# Patient Record
Sex: Female | Born: 1940
Health system: Southern US, Community
[De-identification: ages and names within clinical notes are randomized; demographics above are authoritative.]

## PROBLEM LIST (undated history)

## (undated) DIAGNOSIS — F419 Anxiety disorder, unspecified: Secondary | ICD-10-CM

## (undated) DIAGNOSIS — N63 Unspecified lump in unspecified breast: Secondary | ICD-10-CM

## (undated) DIAGNOSIS — N189 Chronic kidney disease, unspecified: Secondary | ICD-10-CM

## (undated) DIAGNOSIS — F32A Depression, unspecified: Secondary | ICD-10-CM

## (undated) DIAGNOSIS — H269 Unspecified cataract: Secondary | ICD-10-CM

## (undated) DIAGNOSIS — I639 Cerebral infarction, unspecified: Secondary | ICD-10-CM

## (undated) DIAGNOSIS — F329 Major depressive disorder, single episode, unspecified: Secondary | ICD-10-CM

## (undated) DIAGNOSIS — U071 COVID-19: Secondary | ICD-10-CM

## (undated) DIAGNOSIS — K219 Gastro-esophageal reflux disease without esophagitis: Secondary | ICD-10-CM

## (undated) DIAGNOSIS — Z9181 History of falling: Secondary | ICD-10-CM

## (undated) DIAGNOSIS — E119 Type 2 diabetes mellitus without complications: Secondary | ICD-10-CM

## (undated) DIAGNOSIS — R197 Diarrhea, unspecified: Secondary | ICD-10-CM

## (undated) DIAGNOSIS — K52839 Microscopic colitis, unspecified: Secondary | ICD-10-CM

## (undated) DIAGNOSIS — E079 Disorder of thyroid, unspecified: Secondary | ICD-10-CM

## (undated) DIAGNOSIS — I10 Essential (primary) hypertension: Secondary | ICD-10-CM

## (undated) HISTORY — PX: APPENDECTOMY: SHX54

## (undated) HISTORY — DX: Cerebral infarction, unspecified: I63.9

## (undated) HISTORY — PX: CHOLECYSTECTOMY: SHX55

## (undated) HISTORY — DX: Unspecified lump in unspecified breast: N63.0

## (undated) HISTORY — DX: Depression, unspecified: F32.A

## (undated) HISTORY — PX: VAGINAL HYSTERECTOMY: SUR661

## (undated) HISTORY — PX: COLONOSCOPY: SHX174

## (undated) HISTORY — DX: Anxiety disorder, unspecified: F41.9

## (undated) HISTORY — DX: Essential (primary) hypertension: I10

## (undated) HISTORY — PX: TONSILLECTOMY: SUR1361

## (undated) HISTORY — PX: OTHER SURGICAL HISTORY: SHX169

## (undated) HISTORY — PX: HEMORRHOID SURGERY: SHX153

## (undated) HISTORY — PX: UPPER GASTROINTESTINAL ENDOSCOPY: SHX188

## (undated) HISTORY — PX: CATARACT EXTRACTION: SUR2

## (undated) HISTORY — DX: Microscopic colitis, unspecified: K52.839

## (undated) HISTORY — DX: Chronic kidney disease, unspecified: N18.9

## (undated) HISTORY — DX: Unspecified cataract: H26.9

## (undated) HISTORY — DX: Gastro-esophageal reflux disease without esophagitis: K21.9

## (undated) HISTORY — DX: COVID-19: U07.1

## (undated) HISTORY — DX: Diarrhea, unspecified: R19.7

## (undated) HISTORY — DX: History of falling: Z91.81

---
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
## 2005-06-10 ENCOUNTER — Ambulatory Visit (HOSPITAL_COMMUNITY): Admission: RE | Admit: 2005-06-10 | Discharge: 2005-06-10 | Payer: Self-pay | Admitting: General Surgery

## 2005-08-02 ENCOUNTER — Ambulatory Visit: Payer: Self-pay | Admitting: Internal Medicine

## 2007-10-03 ENCOUNTER — Ambulatory Visit: Payer: Self-pay | Admitting: Vascular Surgery

## 2007-12-05 ENCOUNTER — Ambulatory Visit: Payer: Self-pay | Admitting: Vascular Surgery

## 2008-01-16 ENCOUNTER — Ambulatory Visit: Payer: Self-pay | Admitting: Vascular Surgery

## 2009-06-12 ENCOUNTER — Ambulatory Visit (HOSPITAL_COMMUNITY): Admission: RE | Admit: 2009-06-12 | Discharge: 2009-06-12 | Payer: Self-pay | Admitting: General Surgery

## 2010-07-09 LAB — BASIC METABOLIC PANEL
Calcium: 10.1 mg/dL (ref 8.4–10.5)
GFR calc Af Amer: 55 mL/min — ABNORMAL LOW (ref >60)
GFR calc non Af Amer: 45 mL/min — ABNORMAL LOW (ref >60)
Glucose, Bld: 83 mg/dL (ref 70–99)
Sodium: 140 mEq/L (ref 135–145)

## 2010-07-09 LAB — CBC
Hemoglobin: 14.9 g/dL (ref 12.0–15.0)
RDW: 13.2 % (ref 11.5–15.5)
WBC: 7.3 10*3/uL (ref 4.0–10.5)

## 2010-07-09 LAB — CLOTEST (H. PYLORI), BIOPSY: Helicobacter screen: NEGATIVE

## 2010-07-09 LAB — HEPATIC FUNCTION PANEL
ALT: 21 U/L (ref 0–35)
AST: 23 U/L (ref 0–37)
Albumin: 4.3 g/dL (ref 3.5–5.2)
Total Bilirubin: 0.7 mg/dL (ref 0.3–1.2)

## 2010-08-31 NOTE — Assessment & Plan Note (Signed)
OFFICE VISIT   Ellen, Cruz  DOB:  1940/12/23                                       01/16/2008  ZOXWR#:[id number]   The patient presents today for followup of her sclerotherapy of painful  pretibial reticular varicosities on 12/05/2007.  She was unable to keep  her initial postop visit.  She does have good result so far.  She does  have closure of these reticular veins with some residual thrombus in  them.  She does have some tenderness over this and I explained that this  will continue to resolve as the thrombus resolves as well.  I am quite  pleased with her initial result as is the patient.  She will see [place]  again on an as-needed basis.   Larina Earthly, M.D.  Electronically Signed   TFE/MEDQ  D:  01/16/2008  T:  01/17/2008  Job:  [id number]

## 2010-08-31 NOTE — Letter (Signed)
October 03, 2007   Ellen Cruz  [address]  [place], [place] [address]   Re:  Ellen Cruz, ROADS                  DOB:  14-Mar-1941   Dear Aurther Loft:   Thank you for asking me to see this patient for evaluation of her venous  pathology.  As you know, she is a pleasant 70 year old white female with  concern regarding a pretibial varicosity on her left leg.  She does have  a history of very thin skin bilaterally and has had slow healing on the  right pretibial wound from striking this.  She does not have any history  of deep vein thrombosis and no history of significant swelling.  She  reports significant pain, specifically over this area of varicosity over  her left pretibial area.  She reports this is worse with prolonged  standing.   PAST MEDICAL HISTORY:  Significant for elevated cholesterol.  She does  have a history of allergic rhinitis and anxiety.   SOCIAL HISTORY:  She is married with 2 children.  She is retired.  She  does not smoke, or drink alcohol.   REVIEW OF SYSTEMS:  Her weight is reported at 130 pounds, height is 5  feet 6 inches tall.  She has no cardiac, pulmonary, or GI dysfunction.  She does have occasional diarrhea.  Does have pain in her legs.   MEDICATION ALLERGIES:  Codeine and Darvocet.   CURRENT MEDICATIONS:  Valium 10 mg as needed.  Meclizine.   PHYSICAL EXAM:  Well-developed, well-nourished white female appearing  stated age of 58.  Blood pressure is 157/98, pulse 89, respirations 18.  She does have 1 to 2+ posterior tibial pulses bilaterally.  She does  have diffuse tenderness over both lower extremities.  She does not have  any large venous varicosities.  She does have an area of pretibial wound  on the right and on examining this, it does have an eschar present that  appears to be healing.  On the left leg, the area of concern is noted  for a reticular varicosity from just above the ankle to just below the  knee in her pretibial area.  She  does have specific tenderness over  this.   She underwent venous duplex by me and this showed no evidence of reflux  or valvular incompetence of her saphenous vein.  I discussed the  significance of this with the patient.  I explained that it is somewhat  unusual to have such severe pain with a reticular varicosity, but none  the less, she does not appear to have any more serious venous pathology.  I did explain the option of sclerotherapy of this reticular vein to  hopefully achieve symptom relief.  She wishes to proceed with this.  We  will gain pre-approval from her insurance carrier for coverage of this.  She understands it is a brief outpatient procedure in our office.  We  will schedule her at her convenience.  Once again, thank you.   Larina Earthly, M.D.  Electronically Signed   TFE/MEDQ  D:  10/03/2007  T:  10/04/2007  Job:  [id number]

## 2010-08-31 NOTE — Assessment & Plan Note (Signed)
OFFICE VISIT   ORTENCIA, ASKARI  DOB:  10-31-1940                                       12/05/2007  ZOXWR#:[id number]   The patient presents today for sclerotherapy of a reticular varicosity  over her left pretibial area.  She has had persistent pain specifically  over the pretibial reticular vein and extending up onto the knee.  Under  sterile conditions she had a 0.3% sodium tetradecyl injection with a 30  gauge needle.  She had a total of  4 mL.  She had placement of a  compression garment and I plan to see her again in 1 month for continued  followup.   Larina Earthly, M.D.  Electronically Signed   TFE/MEDQ  D:  12/05/2007  T:  12/06/2007  Job:  [id number]

## 2010-09-03 NOTE — H&P (Signed)
NAMESHAMARA, SOZA            ACCOUNT NO.:  [id number]   MEDICAL RECORD NO.:  [id number]           PATIENT TYPE:  AMB   LOCATION:                                FACILITY:  [facility]   PHYSICIAN:  Dalia Heading, M.D.  DATE OF BIRTH:  May 28, 1940   DATE OF ADMISSION:  06/10/2005  DATE OF DISCHARGE:  LH                                HISTORY & PHYSICAL   CHIEF COMPLAINT:  Diarrhea.   HISTORY OF PRESENT ILLNESS:  The patient is a 70 year old, white female who  is referred for endoscopic evaluation.  She needs a colonoscopy for  diarrhea.  It has been present since October 2006.  She was started on  treatment for Staphylococcus infection at that time.  No abdominal pain,  weight loss, nausea, vomiting, constipation, melena or hematochezia have  been noted.  She has never had a recent colonoscopy.  Her father is still  alive with a history of colon carcinoma.   PAST MEDICAL HISTORY:  As noted above.   PAST SURGICAL HISTORY:  1.  Appendectomy.  2.  Hysterectomy.  3.  Tonsillectomy.  4.  Left elbow surgery.   CURRENT MEDICATIONS:  None.   ALLERGIES:  CODEINE and DARVOCET.   REVIEW OF SYSTEMS:  Noncontributory.   PHYSICAL EXAMINATION:  GENERAL:  The patient is a well-developed, well-  nourished, white female in no acute distress.  LUNGS:  Clear to auscultation with equal breath sounds bilaterally.  HEART:  Regular rate and rhythm without S3, S4 or murmurs.  ABDOMEN:  Soft, nontender, nondistended.  No hepatosplenomegaly or masses  are noted.  RECTAL:  Deferred to the procedure.   IMPRESSION:  Diarrhea.   PLAN:  The patient is scheduled for a colonoscopy on June 10, 2005.  The  risks and benefits of the procedure including bleeding and perforation were  fully explained to the patient gaining informed consent.      Dalia Heading, M.D.  Electronically Signed     MAJ/MEDQ  D:  06/02/2005  T:  06/02/2005  Job:  [id number]   cc:   Donzetta Sprung  Fax: [phone]

## 2011-04-29 DIAGNOSIS — E782 Mixed hyperlipidemia: Secondary | ICD-10-CM | POA: Diagnosis not present

## 2011-05-06 DIAGNOSIS — G4700 Insomnia, unspecified: Secondary | ICD-10-CM | POA: Diagnosis not present

## 2011-05-06 DIAGNOSIS — E782 Mixed hyperlipidemia: Secondary | ICD-10-CM | POA: Diagnosis not present

## 2011-05-06 DIAGNOSIS — E039 Hypothyroidism, unspecified: Secondary | ICD-10-CM | POA: Diagnosis not present

## 2011-05-06 DIAGNOSIS — J309 Allergic rhinitis, unspecified: Secondary | ICD-10-CM | POA: Diagnosis not present

## 2011-05-06 DIAGNOSIS — M1990 Unspecified osteoarthritis, unspecified site: Secondary | ICD-10-CM | POA: Diagnosis not present

## 2011-05-06 DIAGNOSIS — IMO0002 Reserved for concepts with insufficient information to code with codable children: Secondary | ICD-10-CM | POA: Diagnosis not present

## 2011-08-08 DIAGNOSIS — G4700 Insomnia, unspecified: Secondary | ICD-10-CM | POA: Diagnosis not present

## 2011-08-08 DIAGNOSIS — E039 Hypothyroidism, unspecified: Secondary | ICD-10-CM | POA: Diagnosis not present

## 2011-08-08 DIAGNOSIS — E7800 Pure hypercholesterolemia, unspecified: Secondary | ICD-10-CM | POA: Diagnosis not present

## 2011-08-08 DIAGNOSIS — E119 Type 2 diabetes mellitus without complications: Secondary | ICD-10-CM | POA: Diagnosis not present

## 2011-08-08 DIAGNOSIS — IMO0002 Reserved for concepts with insufficient information to code with codable children: Secondary | ICD-10-CM | POA: Diagnosis not present

## 2011-08-08 DIAGNOSIS — M1990 Unspecified osteoarthritis, unspecified site: Secondary | ICD-10-CM | POA: Diagnosis not present

## 2011-08-08 DIAGNOSIS — E782 Mixed hyperlipidemia: Secondary | ICD-10-CM | POA: Diagnosis not present

## 2011-08-08 DIAGNOSIS — H6120 Impacted cerumen, unspecified ear: Secondary | ICD-10-CM | POA: Diagnosis not present

## 2011-08-08 DIAGNOSIS — Z79899 Other long term (current) drug therapy: Secondary | ICD-10-CM | POA: Diagnosis not present

## 2011-08-08 DIAGNOSIS — J309 Allergic rhinitis, unspecified: Secondary | ICD-10-CM | POA: Diagnosis not present

## 2011-08-15 DIAGNOSIS — H9209 Otalgia, unspecified ear: Secondary | ICD-10-CM | POA: Diagnosis not present

## 2011-08-15 DIAGNOSIS — H6120 Impacted cerumen, unspecified ear: Secondary | ICD-10-CM | POA: Diagnosis not present

## 2011-08-15 DIAGNOSIS — J029 Acute pharyngitis, unspecified: Secondary | ICD-10-CM | POA: Diagnosis not present

## 2011-10-28 DIAGNOSIS — I10 Essential (primary) hypertension: Secondary | ICD-10-CM | POA: Diagnosis not present

## 2011-10-28 DIAGNOSIS — E039 Hypothyroidism, unspecified: Secondary | ICD-10-CM | POA: Diagnosis not present

## 2011-10-28 DIAGNOSIS — E782 Mixed hyperlipidemia: Secondary | ICD-10-CM | POA: Diagnosis not present

## 2011-11-04 DIAGNOSIS — M719 Bursopathy, unspecified: Secondary | ICD-10-CM | POA: Diagnosis not present

## 2011-11-04 DIAGNOSIS — M1990 Unspecified osteoarthritis, unspecified site: Secondary | ICD-10-CM | POA: Diagnosis not present

## 2011-11-04 DIAGNOSIS — J309 Allergic rhinitis, unspecified: Secondary | ICD-10-CM | POA: Diagnosis not present

## 2011-11-04 DIAGNOSIS — E782 Mixed hyperlipidemia: Secondary | ICD-10-CM | POA: Diagnosis not present

## 2011-11-04 DIAGNOSIS — IMO0002 Reserved for concepts with insufficient information to code with codable children: Secondary | ICD-10-CM | POA: Diagnosis not present

## 2011-11-04 DIAGNOSIS — G4700 Insomnia, unspecified: Secondary | ICD-10-CM | POA: Diagnosis not present

## 2011-11-04 DIAGNOSIS — E039 Hypothyroidism, unspecified: Secondary | ICD-10-CM | POA: Diagnosis not present

## 2011-11-04 DIAGNOSIS — M67919 Unspecified disorder of synovium and tendon, unspecified shoulder: Secondary | ICD-10-CM | POA: Diagnosis not present

## 2011-11-04 DIAGNOSIS — J029 Acute pharyngitis, unspecified: Secondary | ICD-10-CM | POA: Diagnosis not present

## 2011-11-18 DIAGNOSIS — H43399 Other vitreous opacities, unspecified eye: Secondary | ICD-10-CM | POA: Diagnosis not present

## 2011-11-18 DIAGNOSIS — E119 Type 2 diabetes mellitus without complications: Secondary | ICD-10-CM | POA: Diagnosis not present

## 2011-12-05 DIAGNOSIS — H8110 Benign paroxysmal vertigo, unspecified ear: Secondary | ICD-10-CM | POA: Diagnosis not present

## 2011-12-05 DIAGNOSIS — R197 Diarrhea, unspecified: Secondary | ICD-10-CM | POA: Diagnosis not present

## 2011-12-05 DIAGNOSIS — H6120 Impacted cerumen, unspecified ear: Secondary | ICD-10-CM | POA: Diagnosis not present

## 2011-12-05 DIAGNOSIS — R110 Nausea: Secondary | ICD-10-CM | POA: Diagnosis not present

## 2011-12-24 DIAGNOSIS — S91009A Unspecified open wound, unspecified ankle, initial encounter: Secondary | ICD-10-CM | POA: Diagnosis not present

## 2011-12-24 DIAGNOSIS — Z23 Encounter for immunization: Secondary | ICD-10-CM | POA: Diagnosis not present

## 2011-12-24 DIAGNOSIS — Z79899 Other long term (current) drug therapy: Secondary | ICD-10-CM | POA: Diagnosis not present

## 2011-12-26 DIAGNOSIS — S81009A Unspecified open wound, unspecified knee, initial encounter: Secondary | ICD-10-CM | POA: Diagnosis not present

## 2012-01-03 DIAGNOSIS — S81009A Unspecified open wound, unspecified knee, initial encounter: Secondary | ICD-10-CM | POA: Diagnosis not present

## 2012-01-03 DIAGNOSIS — S81809A Unspecified open wound, unspecified lower leg, initial encounter: Secondary | ICD-10-CM | POA: Diagnosis not present

## 2012-01-09 DIAGNOSIS — S91009A Unspecified open wound, unspecified ankle, initial encounter: Secondary | ICD-10-CM | POA: Diagnosis not present

## 2012-01-09 DIAGNOSIS — S81809A Unspecified open wound, unspecified lower leg, initial encounter: Secondary | ICD-10-CM | POA: Diagnosis not present

## 2012-01-11 DIAGNOSIS — E119 Type 2 diabetes mellitus without complications: Secondary | ICD-10-CM | POA: Diagnosis not present

## 2012-01-11 DIAGNOSIS — Z833 Family history of diabetes mellitus: Secondary | ICD-10-CM | POA: Diagnosis not present

## 2012-01-11 DIAGNOSIS — S81009A Unspecified open wound, unspecified knee, initial encounter: Secondary | ICD-10-CM | POA: Diagnosis not present

## 2012-01-11 DIAGNOSIS — Z8249 Family history of ischemic heart disease and other diseases of the circulatory system: Secondary | ICD-10-CM | POA: Diagnosis not present

## 2012-01-11 DIAGNOSIS — E039 Hypothyroidism, unspecified: Secondary | ICD-10-CM | POA: Diagnosis not present

## 2012-01-11 DIAGNOSIS — IMO0002 Reserved for concepts with insufficient information to code with codable children: Secondary | ICD-10-CM | POA: Diagnosis not present

## 2012-01-11 DIAGNOSIS — Z885 Allergy status to narcotic agent status: Secondary | ICD-10-CM | POA: Diagnosis not present

## 2012-01-11 DIAGNOSIS — I83009 Varicose veins of unspecified lower extremity with ulcer of unspecified site: Secondary | ICD-10-CM | POA: Diagnosis not present

## 2012-01-11 DIAGNOSIS — E8779 Other fluid overload: Secondary | ICD-10-CM | POA: Diagnosis not present

## 2012-01-11 DIAGNOSIS — I8390 Asymptomatic varicose veins of unspecified lower extremity: Secondary | ICD-10-CM | POA: Diagnosis not present

## 2012-01-11 DIAGNOSIS — L97909 Non-pressure chronic ulcer of unspecified part of unspecified lower leg with unspecified severity: Secondary | ICD-10-CM | POA: Diagnosis not present

## 2012-01-11 DIAGNOSIS — L97809 Non-pressure chronic ulcer of other part of unspecified lower leg with unspecified severity: Secondary | ICD-10-CM | POA: Diagnosis not present

## 2012-01-11 DIAGNOSIS — Z809 Family history of malignant neoplasm, unspecified: Secondary | ICD-10-CM | POA: Diagnosis not present

## 2012-01-11 DIAGNOSIS — M79609 Pain in unspecified limb: Secondary | ICD-10-CM | POA: Diagnosis not present

## 2012-01-11 DIAGNOSIS — Z79899 Other long term (current) drug therapy: Secondary | ICD-10-CM | POA: Diagnosis not present

## 2012-01-11 DIAGNOSIS — S91009A Unspecified open wound, unspecified ankle, initial encounter: Secondary | ICD-10-CM | POA: Diagnosis not present

## 2012-01-18 DIAGNOSIS — L97909 Non-pressure chronic ulcer of unspecified part of unspecified lower leg with unspecified severity: Secondary | ICD-10-CM | POA: Diagnosis not present

## 2012-01-18 DIAGNOSIS — IMO0001 Reserved for inherently not codable concepts without codable children: Secondary | ICD-10-CM | POA: Diagnosis not present

## 2012-01-18 DIAGNOSIS — L97809 Non-pressure chronic ulcer of other part of unspecified lower leg with unspecified severity: Secondary | ICD-10-CM | POA: Diagnosis not present

## 2012-01-18 DIAGNOSIS — I872 Venous insufficiency (chronic) (peripheral): Secondary | ICD-10-CM | POA: Diagnosis not present

## 2012-01-18 DIAGNOSIS — I83009 Varicose veins of unspecified lower extremity with ulcer of unspecified site: Secondary | ICD-10-CM | POA: Diagnosis not present

## 2012-01-25 DIAGNOSIS — L97809 Non-pressure chronic ulcer of other part of unspecified lower leg with unspecified severity: Secondary | ICD-10-CM | POA: Diagnosis not present

## 2012-01-25 DIAGNOSIS — L97909 Non-pressure chronic ulcer of unspecified part of unspecified lower leg with unspecified severity: Secondary | ICD-10-CM | POA: Diagnosis not present

## 2012-01-25 DIAGNOSIS — I872 Venous insufficiency (chronic) (peripheral): Secondary | ICD-10-CM | POA: Diagnosis not present

## 2012-01-25 DIAGNOSIS — IMO0001 Reserved for inherently not codable concepts without codable children: Secondary | ICD-10-CM | POA: Diagnosis not present

## 2012-02-01 DIAGNOSIS — L97809 Non-pressure chronic ulcer of other part of unspecified lower leg with unspecified severity: Secondary | ICD-10-CM | POA: Diagnosis not present

## 2012-02-01 DIAGNOSIS — I83009 Varicose veins of unspecified lower extremity with ulcer of unspecified site: Secondary | ICD-10-CM | POA: Diagnosis not present

## 2012-02-01 DIAGNOSIS — L97909 Non-pressure chronic ulcer of unspecified part of unspecified lower leg with unspecified severity: Secondary | ICD-10-CM | POA: Diagnosis not present

## 2012-02-01 DIAGNOSIS — IMO0001 Reserved for inherently not codable concepts without codable children: Secondary | ICD-10-CM | POA: Diagnosis not present

## 2012-02-01 DIAGNOSIS — I872 Venous insufficiency (chronic) (peripheral): Secondary | ICD-10-CM | POA: Diagnosis not present

## 2012-02-01 DIAGNOSIS — S81809A Unspecified open wound, unspecified lower leg, initial encounter: Secondary | ICD-10-CM | POA: Diagnosis not present

## 2012-02-16 DIAGNOSIS — E039 Hypothyroidism, unspecified: Secondary | ICD-10-CM | POA: Diagnosis not present

## 2012-02-16 DIAGNOSIS — E782 Mixed hyperlipidemia: Secondary | ICD-10-CM | POA: Diagnosis not present

## 2012-02-16 DIAGNOSIS — G4700 Insomnia, unspecified: Secondary | ICD-10-CM | POA: Diagnosis not present

## 2012-02-16 DIAGNOSIS — E119 Type 2 diabetes mellitus without complications: Secondary | ICD-10-CM | POA: Diagnosis not present

## 2012-02-16 DIAGNOSIS — IMO0002 Reserved for concepts with insufficient information to code with codable children: Secondary | ICD-10-CM | POA: Diagnosis not present

## 2012-02-16 DIAGNOSIS — I10 Essential (primary) hypertension: Secondary | ICD-10-CM | POA: Diagnosis not present

## 2012-02-27 DIAGNOSIS — J029 Acute pharyngitis, unspecified: Secondary | ICD-10-CM | POA: Diagnosis not present

## 2012-02-27 DIAGNOSIS — L600 Ingrowing nail: Secondary | ICD-10-CM | POA: Diagnosis not present

## 2012-04-06 DIAGNOSIS — J209 Acute bronchitis, unspecified: Secondary | ICD-10-CM | POA: Diagnosis not present

## 2012-04-06 DIAGNOSIS — J019 Acute sinusitis, unspecified: Secondary | ICD-10-CM | POA: Diagnosis not present

## 2012-05-10 DIAGNOSIS — H6120 Impacted cerumen, unspecified ear: Secondary | ICD-10-CM | POA: Diagnosis not present

## 2012-05-25 DIAGNOSIS — E782 Mixed hyperlipidemia: Secondary | ICD-10-CM | POA: Diagnosis not present

## 2012-05-25 DIAGNOSIS — E039 Hypothyroidism, unspecified: Secondary | ICD-10-CM | POA: Diagnosis not present

## 2012-05-25 DIAGNOSIS — IMO0001 Reserved for inherently not codable concepts without codable children: Secondary | ICD-10-CM | POA: Diagnosis not present

## 2012-06-25 DIAGNOSIS — R5381 Other malaise: Secondary | ICD-10-CM | POA: Diagnosis not present

## 2012-06-25 DIAGNOSIS — H8110 Benign paroxysmal vertigo, unspecified ear: Secondary | ICD-10-CM | POA: Diagnosis not present

## 2012-06-25 DIAGNOSIS — E119 Type 2 diabetes mellitus without complications: Secondary | ICD-10-CM | POA: Diagnosis not present

## 2012-06-25 DIAGNOSIS — E039 Hypothyroidism, unspecified: Secondary | ICD-10-CM | POA: Diagnosis not present

## 2012-08-07 DIAGNOSIS — E039 Hypothyroidism, unspecified: Secondary | ICD-10-CM | POA: Diagnosis not present

## 2012-08-09 DIAGNOSIS — I10 Essential (primary) hypertension: Secondary | ICD-10-CM | POA: Diagnosis not present

## 2012-08-09 DIAGNOSIS — H6120 Impacted cerumen, unspecified ear: Secondary | ICD-10-CM | POA: Diagnosis not present

## 2012-08-09 DIAGNOSIS — H8110 Benign paroxysmal vertigo, unspecified ear: Secondary | ICD-10-CM | POA: Diagnosis not present

## 2012-08-09 DIAGNOSIS — R5383 Other fatigue: Secondary | ICD-10-CM | POA: Diagnosis not present

## 2012-08-09 DIAGNOSIS — E782 Mixed hyperlipidemia: Secondary | ICD-10-CM | POA: Diagnosis not present

## 2012-08-09 DIAGNOSIS — E119 Type 2 diabetes mellitus without complications: Secondary | ICD-10-CM | POA: Diagnosis not present

## 2012-08-09 DIAGNOSIS — E039 Hypothyroidism, unspecified: Secondary | ICD-10-CM | POA: Diagnosis not present

## 2012-09-17 DIAGNOSIS — T148XXA Other injury of unspecified body region, initial encounter: Secondary | ICD-10-CM | POA: Diagnosis not present

## 2012-09-17 DIAGNOSIS — S8010XA Contusion of unspecified lower leg, initial encounter: Secondary | ICD-10-CM | POA: Diagnosis not present

## 2012-09-20 DIAGNOSIS — T148XXA Other injury of unspecified body region, initial encounter: Secondary | ICD-10-CM | POA: Diagnosis not present

## 2012-09-20 DIAGNOSIS — M7989 Other specified soft tissue disorders: Secondary | ICD-10-CM | POA: Diagnosis not present

## 2012-09-20 DIAGNOSIS — R609 Edema, unspecified: Secondary | ICD-10-CM | POA: Diagnosis not present

## 2012-09-20 DIAGNOSIS — S8010XA Contusion of unspecified lower leg, initial encounter: Secondary | ICD-10-CM | POA: Diagnosis not present

## 2012-09-20 DIAGNOSIS — M79609 Pain in unspecified limb: Secondary | ICD-10-CM | POA: Diagnosis not present

## 2012-09-24 DIAGNOSIS — E039 Hypothyroidism, unspecified: Secondary | ICD-10-CM | POA: Diagnosis not present

## 2012-09-24 DIAGNOSIS — E119 Type 2 diabetes mellitus without complications: Secondary | ICD-10-CM | POA: Diagnosis not present

## 2012-09-24 DIAGNOSIS — H8110 Benign paroxysmal vertigo, unspecified ear: Secondary | ICD-10-CM | POA: Diagnosis not present

## 2012-09-24 DIAGNOSIS — I10 Essential (primary) hypertension: Secondary | ICD-10-CM | POA: Diagnosis not present

## 2012-09-24 DIAGNOSIS — R5383 Other fatigue: Secondary | ICD-10-CM | POA: Diagnosis not present

## 2012-09-24 DIAGNOSIS — E782 Mixed hyperlipidemia: Secondary | ICD-10-CM | POA: Diagnosis not present

## 2012-10-01 DIAGNOSIS — E782 Mixed hyperlipidemia: Secondary | ICD-10-CM | POA: Diagnosis not present

## 2012-10-01 DIAGNOSIS — I10 Essential (primary) hypertension: Secondary | ICD-10-CM | POA: Diagnosis not present

## 2012-10-01 DIAGNOSIS — N189 Chronic kidney disease, unspecified: Secondary | ICD-10-CM | POA: Diagnosis not present

## 2012-10-01 DIAGNOSIS — R5381 Other malaise: Secondary | ICD-10-CM | POA: Diagnosis not present

## 2012-10-01 DIAGNOSIS — H8110 Benign paroxysmal vertigo, unspecified ear: Secondary | ICD-10-CM | POA: Diagnosis not present

## 2012-10-01 DIAGNOSIS — E039 Hypothyroidism, unspecified: Secondary | ICD-10-CM | POA: Diagnosis not present

## 2012-10-01 DIAGNOSIS — E119 Type 2 diabetes mellitus without complications: Secondary | ICD-10-CM | POA: Diagnosis not present

## 2012-10-01 DIAGNOSIS — R5383 Other fatigue: Secondary | ICD-10-CM | POA: Diagnosis not present

## 2012-12-28 DIAGNOSIS — M76899 Other specified enthesopathies of unspecified lower limb, excluding foot: Secondary | ICD-10-CM | POA: Diagnosis not present

## 2012-12-28 DIAGNOSIS — J019 Acute sinusitis, unspecified: Secondary | ICD-10-CM | POA: Diagnosis not present

## 2012-12-28 DIAGNOSIS — J029 Acute pharyngitis, unspecified: Secondary | ICD-10-CM | POA: Diagnosis not present

## 2013-01-23 DIAGNOSIS — M79609 Pain in unspecified limb: Secondary | ICD-10-CM | POA: Diagnosis not present

## 2013-01-23 DIAGNOSIS — L600 Ingrowing nail: Secondary | ICD-10-CM | POA: Diagnosis not present

## 2013-02-06 DIAGNOSIS — M79609 Pain in unspecified limb: Secondary | ICD-10-CM | POA: Diagnosis not present

## 2013-02-06 DIAGNOSIS — L600 Ingrowing nail: Secondary | ICD-10-CM | POA: Diagnosis not present

## 2013-02-11 DIAGNOSIS — M79609 Pain in unspecified limb: Secondary | ICD-10-CM | POA: Diagnosis not present

## 2013-02-11 DIAGNOSIS — H6120 Impacted cerumen, unspecified ear: Secondary | ICD-10-CM | POA: Diagnosis not present

## 2013-02-11 DIAGNOSIS — L600 Ingrowing nail: Secondary | ICD-10-CM | POA: Diagnosis not present

## 2013-02-12 DIAGNOSIS — R110 Nausea: Secondary | ICD-10-CM | POA: Diagnosis not present

## 2013-02-12 DIAGNOSIS — IMO0002 Reserved for concepts with insufficient information to code with codable children: Secondary | ICD-10-CM | POA: Diagnosis not present

## 2013-02-13 DIAGNOSIS — I10 Essential (primary) hypertension: Secondary | ICD-10-CM | POA: Diagnosis not present

## 2013-02-13 DIAGNOSIS — R5381 Other malaise: Secondary | ICD-10-CM | POA: Diagnosis not present

## 2013-02-13 DIAGNOSIS — E782 Mixed hyperlipidemia: Secondary | ICD-10-CM | POA: Diagnosis not present

## 2013-02-13 DIAGNOSIS — IMO0001 Reserved for inherently not codable concepts without codable children: Secondary | ICD-10-CM | POA: Diagnosis not present

## 2013-02-13 DIAGNOSIS — L509 Urticaria, unspecified: Secondary | ICD-10-CM | POA: Diagnosis not present

## 2013-02-13 DIAGNOSIS — E039 Hypothyroidism, unspecified: Secondary | ICD-10-CM | POA: Diagnosis not present

## 2013-02-18 DIAGNOSIS — H8110 Benign paroxysmal vertigo, unspecified ear: Secondary | ICD-10-CM | POA: Diagnosis not present

## 2013-02-18 DIAGNOSIS — I10 Essential (primary) hypertension: Secondary | ICD-10-CM | POA: Diagnosis not present

## 2013-02-18 DIAGNOSIS — E039 Hypothyroidism, unspecified: Secondary | ICD-10-CM | POA: Diagnosis not present

## 2013-02-18 DIAGNOSIS — R5381 Other malaise: Secondary | ICD-10-CM | POA: Diagnosis not present

## 2013-02-18 DIAGNOSIS — Z1331 Encounter for screening for depression: Secondary | ICD-10-CM | POA: Diagnosis not present

## 2013-02-18 DIAGNOSIS — E1149 Type 2 diabetes mellitus with other diabetic neurological complication: Secondary | ICD-10-CM | POA: Diagnosis not present

## 2013-02-18 DIAGNOSIS — E782 Mixed hyperlipidemia: Secondary | ICD-10-CM | POA: Diagnosis not present

## 2013-02-25 DIAGNOSIS — S81009A Unspecified open wound, unspecified knee, initial encounter: Secondary | ICD-10-CM | POA: Diagnosis not present

## 2013-02-27 DIAGNOSIS — M79609 Pain in unspecified limb: Secondary | ICD-10-CM | POA: Diagnosis not present

## 2013-02-27 DIAGNOSIS — L600 Ingrowing nail: Secondary | ICD-10-CM | POA: Diagnosis not present

## 2013-02-28 DIAGNOSIS — N189 Chronic kidney disease, unspecified: Secondary | ICD-10-CM | POA: Diagnosis not present

## 2013-03-11 DIAGNOSIS — L600 Ingrowing nail: Secondary | ICD-10-CM | POA: Diagnosis not present

## 2013-03-11 DIAGNOSIS — M79609 Pain in unspecified limb: Secondary | ICD-10-CM | POA: Diagnosis not present

## 2013-03-15 DIAGNOSIS — E1149 Type 2 diabetes mellitus with other diabetic neurological complication: Secondary | ICD-10-CM | POA: Diagnosis not present

## 2013-03-15 DIAGNOSIS — H8110 Benign paroxysmal vertigo, unspecified ear: Secondary | ICD-10-CM | POA: Diagnosis not present

## 2013-03-15 DIAGNOSIS — I10 Essential (primary) hypertension: Secondary | ICD-10-CM | POA: Diagnosis not present

## 2013-03-15 DIAGNOSIS — R5381 Other malaise: Secondary | ICD-10-CM | POA: Diagnosis not present

## 2013-03-15 DIAGNOSIS — E782 Mixed hyperlipidemia: Secondary | ICD-10-CM | POA: Diagnosis not present

## 2013-03-15 DIAGNOSIS — E039 Hypothyroidism, unspecified: Secondary | ICD-10-CM | POA: Diagnosis not present

## 2013-04-08 DIAGNOSIS — B9789 Other viral agents as the cause of diseases classified elsewhere: Secondary | ICD-10-CM | POA: Diagnosis not present

## 2013-04-08 DIAGNOSIS — J029 Acute pharyngitis, unspecified: Secondary | ICD-10-CM | POA: Diagnosis not present

## 2013-04-19 DIAGNOSIS — H6120 Impacted cerumen, unspecified ear: Secondary | ICD-10-CM | POA: Diagnosis not present

## 2013-05-06 DIAGNOSIS — Z1231 Encounter for screening mammogram for malignant neoplasm of breast: Secondary | ICD-10-CM | POA: Diagnosis not present

## 2013-06-11 DIAGNOSIS — E782 Mixed hyperlipidemia: Secondary | ICD-10-CM | POA: Diagnosis not present

## 2013-06-11 DIAGNOSIS — M1990 Unspecified osteoarthritis, unspecified site: Secondary | ICD-10-CM | POA: Diagnosis not present

## 2013-06-11 DIAGNOSIS — N183 Chronic kidney disease, stage 3 unspecified: Secondary | ICD-10-CM | POA: Diagnosis not present

## 2013-06-11 DIAGNOSIS — I10 Essential (primary) hypertension: Secondary | ICD-10-CM | POA: Diagnosis not present

## 2013-06-11 DIAGNOSIS — E119 Type 2 diabetes mellitus without complications: Secondary | ICD-10-CM | POA: Diagnosis not present

## 2013-06-11 DIAGNOSIS — E039 Hypothyroidism, unspecified: Secondary | ICD-10-CM | POA: Diagnosis not present

## 2013-06-18 DIAGNOSIS — E1129 Type 2 diabetes mellitus with other diabetic kidney complication: Secondary | ICD-10-CM | POA: Diagnosis not present

## 2013-06-18 DIAGNOSIS — I10 Essential (primary) hypertension: Secondary | ICD-10-CM | POA: Diagnosis not present

## 2013-06-18 DIAGNOSIS — R5381 Other malaise: Secondary | ICD-10-CM | POA: Diagnosis not present

## 2013-06-18 DIAGNOSIS — R5383 Other fatigue: Secondary | ICD-10-CM | POA: Diagnosis not present

## 2013-06-18 DIAGNOSIS — K2100 Gastro-esophageal reflux disease with esophagitis, without bleeding: Secondary | ICD-10-CM | POA: Diagnosis not present

## 2013-06-18 DIAGNOSIS — E782 Mixed hyperlipidemia: Secondary | ICD-10-CM | POA: Diagnosis not present

## 2013-06-18 DIAGNOSIS — H8110 Benign paroxysmal vertigo, unspecified ear: Secondary | ICD-10-CM | POA: Diagnosis not present

## 2013-06-18 DIAGNOSIS — N183 Chronic kidney disease, stage 3 unspecified: Secondary | ICD-10-CM | POA: Diagnosis not present

## 2013-06-18 DIAGNOSIS — E039 Hypothyroidism, unspecified: Secondary | ICD-10-CM | POA: Diagnosis not present

## 2013-07-17 DIAGNOSIS — I10 Essential (primary) hypertension: Secondary | ICD-10-CM | POA: Diagnosis not present

## 2013-07-17 DIAGNOSIS — R5381 Other malaise: Secondary | ICD-10-CM | POA: Diagnosis not present

## 2013-07-17 DIAGNOSIS — R059 Cough, unspecified: Secondary | ICD-10-CM | POA: Diagnosis not present

## 2013-07-17 DIAGNOSIS — R05 Cough: Secondary | ICD-10-CM | POA: Diagnosis not present

## 2013-07-17 DIAGNOSIS — R300 Dysuria: Secondary | ICD-10-CM | POA: Diagnosis not present

## 2013-08-12 DIAGNOSIS — H6120 Impacted cerumen, unspecified ear: Secondary | ICD-10-CM | POA: Diagnosis not present

## 2013-09-02 DIAGNOSIS — R5381 Other malaise: Secondary | ICD-10-CM | POA: Diagnosis not present

## 2013-09-02 DIAGNOSIS — R5383 Other fatigue: Secondary | ICD-10-CM | POA: Diagnosis not present

## 2013-10-09 DIAGNOSIS — R5381 Other malaise: Secondary | ICD-10-CM | POA: Diagnosis not present

## 2013-10-09 DIAGNOSIS — R5383 Other fatigue: Secondary | ICD-10-CM | POA: Diagnosis not present

## 2013-10-28 DIAGNOSIS — G459 Transient cerebral ischemic attack, unspecified: Secondary | ICD-10-CM | POA: Diagnosis not present

## 2013-10-31 DIAGNOSIS — G459 Transient cerebral ischemic attack, unspecified: Secondary | ICD-10-CM | POA: Diagnosis not present

## 2013-11-13 DIAGNOSIS — N183 Chronic kidney disease, stage 3 unspecified: Secondary | ICD-10-CM | POA: Diagnosis not present

## 2013-11-13 DIAGNOSIS — I10 Essential (primary) hypertension: Secondary | ICD-10-CM | POA: Diagnosis not present

## 2013-11-13 DIAGNOSIS — IMO0001 Reserved for inherently not codable concepts without codable children: Secondary | ICD-10-CM | POA: Diagnosis not present

## 2013-11-13 DIAGNOSIS — R5381 Other malaise: Secondary | ICD-10-CM | POA: Diagnosis not present

## 2013-11-13 DIAGNOSIS — E039 Hypothyroidism, unspecified: Secondary | ICD-10-CM | POA: Diagnosis not present

## 2013-11-13 DIAGNOSIS — R5383 Other fatigue: Secondary | ICD-10-CM | POA: Diagnosis not present

## 2013-11-19 DIAGNOSIS — I10 Essential (primary) hypertension: Secondary | ICD-10-CM | POA: Diagnosis not present

## 2013-11-19 DIAGNOSIS — Z1331 Encounter for screening for depression: Secondary | ICD-10-CM | POA: Diagnosis not present

## 2013-11-19 DIAGNOSIS — R5383 Other fatigue: Secondary | ICD-10-CM | POA: Diagnosis not present

## 2013-11-19 DIAGNOSIS — E1129 Type 2 diabetes mellitus with other diabetic kidney complication: Secondary | ICD-10-CM | POA: Diagnosis not present

## 2013-11-19 DIAGNOSIS — E039 Hypothyroidism, unspecified: Secondary | ICD-10-CM | POA: Diagnosis not present

## 2013-11-19 DIAGNOSIS — N183 Chronic kidney disease, stage 3 unspecified: Secondary | ICD-10-CM | POA: Diagnosis not present

## 2013-11-19 DIAGNOSIS — K2100 Gastro-esophageal reflux disease with esophagitis, without bleeding: Secondary | ICD-10-CM | POA: Diagnosis not present

## 2013-11-19 DIAGNOSIS — R5381 Other malaise: Secondary | ICD-10-CM | POA: Diagnosis not present

## 2013-11-19 DIAGNOSIS — E782 Mixed hyperlipidemia: Secondary | ICD-10-CM | POA: Diagnosis not present

## 2013-12-17 DIAGNOSIS — H6120 Impacted cerumen, unspecified ear: Secondary | ICD-10-CM | POA: Diagnosis not present

## 2014-03-14 DIAGNOSIS — E039 Hypothyroidism, unspecified: Secondary | ICD-10-CM | POA: Diagnosis not present

## 2014-03-14 DIAGNOSIS — E1165 Type 2 diabetes mellitus with hyperglycemia: Secondary | ICD-10-CM | POA: Diagnosis not present

## 2014-03-14 DIAGNOSIS — I10 Essential (primary) hypertension: Secondary | ICD-10-CM | POA: Diagnosis not present

## 2014-03-18 DIAGNOSIS — Z8669 Personal history of other diseases of the nervous system and sense organs: Secondary | ICD-10-CM | POA: Diagnosis not present

## 2014-03-21 DIAGNOSIS — E039 Hypothyroidism, unspecified: Secondary | ICD-10-CM | POA: Diagnosis not present

## 2014-03-21 DIAGNOSIS — E1122 Type 2 diabetes mellitus with diabetic chronic kidney disease: Secondary | ICD-10-CM | POA: Diagnosis not present

## 2014-03-21 DIAGNOSIS — E782 Mixed hyperlipidemia: Secondary | ICD-10-CM | POA: Diagnosis not present

## 2014-03-21 DIAGNOSIS — F411 Generalized anxiety disorder: Secondary | ICD-10-CM | POA: Diagnosis not present

## 2014-03-21 DIAGNOSIS — Z1389 Encounter for screening for other disorder: Secondary | ICD-10-CM | POA: Diagnosis not present

## 2014-03-21 DIAGNOSIS — E1142 Type 2 diabetes mellitus with diabetic polyneuropathy: Secondary | ICD-10-CM | POA: Diagnosis not present

## 2014-03-21 DIAGNOSIS — Z9189 Other specified personal risk factors, not elsewhere classified: Secondary | ICD-10-CM | POA: Diagnosis not present

## 2014-03-21 DIAGNOSIS — F324 Major depressive disorder, single episode, in partial remission: Secondary | ICD-10-CM | POA: Diagnosis not present

## 2014-06-17 DIAGNOSIS — Z8669 Personal history of other diseases of the nervous system and sense organs: Secondary | ICD-10-CM | POA: Diagnosis not present

## 2014-07-16 DIAGNOSIS — K210 Gastro-esophageal reflux disease with esophagitis: Secondary | ICD-10-CM | POA: Diagnosis not present

## 2014-07-16 DIAGNOSIS — E039 Hypothyroidism, unspecified: Secondary | ICD-10-CM | POA: Diagnosis not present

## 2014-07-16 DIAGNOSIS — N189 Chronic kidney disease, unspecified: Secondary | ICD-10-CM | POA: Diagnosis not present

## 2014-07-16 DIAGNOSIS — F324 Major depressive disorder, single episode, in partial remission: Secondary | ICD-10-CM | POA: Diagnosis not present

## 2014-07-16 DIAGNOSIS — E1122 Type 2 diabetes mellitus with diabetic chronic kidney disease: Secondary | ICD-10-CM | POA: Diagnosis not present

## 2014-07-16 DIAGNOSIS — I10 Essential (primary) hypertension: Secondary | ICD-10-CM | POA: Diagnosis not present

## 2014-07-24 DIAGNOSIS — F411 Generalized anxiety disorder: Secondary | ICD-10-CM | POA: Diagnosis not present

## 2014-07-24 DIAGNOSIS — J301 Allergic rhinitis due to pollen: Secondary | ICD-10-CM | POA: Diagnosis not present

## 2014-07-24 DIAGNOSIS — E782 Mixed hyperlipidemia: Secondary | ICD-10-CM | POA: Diagnosis not present

## 2014-07-24 DIAGNOSIS — N183 Chronic kidney disease, stage 3 (moderate): Secondary | ICD-10-CM | POA: Diagnosis not present

## 2014-07-24 DIAGNOSIS — K219 Gastro-esophageal reflux disease without esophagitis: Secondary | ICD-10-CM | POA: Diagnosis not present

## 2014-07-24 DIAGNOSIS — E1142 Type 2 diabetes mellitus with diabetic polyneuropathy: Secondary | ICD-10-CM | POA: Diagnosis not present

## 2014-07-24 DIAGNOSIS — E039 Hypothyroidism, unspecified: Secondary | ICD-10-CM | POA: Diagnosis not present

## 2014-07-24 DIAGNOSIS — I10 Essential (primary) hypertension: Secondary | ICD-10-CM | POA: Diagnosis not present

## 2014-07-24 DIAGNOSIS — F324 Major depressive disorder, single episode, in partial remission: Secondary | ICD-10-CM | POA: Diagnosis not present

## 2014-07-24 DIAGNOSIS — E1122 Type 2 diabetes mellitus with diabetic chronic kidney disease: Secondary | ICD-10-CM | POA: Diagnosis not present

## 2014-08-14 DIAGNOSIS — R0602 Shortness of breath: Secondary | ICD-10-CM | POA: Diagnosis not present

## 2014-08-14 DIAGNOSIS — R072 Precordial pain: Secondary | ICD-10-CM | POA: Diagnosis not present

## 2014-08-14 DIAGNOSIS — E782 Mixed hyperlipidemia: Secondary | ICD-10-CM | POA: Diagnosis not present

## 2014-08-14 DIAGNOSIS — E039 Hypothyroidism, unspecified: Secondary | ICD-10-CM | POA: Diagnosis not present

## 2014-08-14 DIAGNOSIS — E1165 Type 2 diabetes mellitus with hyperglycemia: Secondary | ICD-10-CM | POA: Diagnosis not present

## 2014-10-13 DIAGNOSIS — Z9889 Other specified postprocedural states: Secondary | ICD-10-CM | POA: Diagnosis not present

## 2014-10-13 DIAGNOSIS — E0590 Thyrotoxicosis, unspecified without thyrotoxic crisis or storm: Secondary | ICD-10-CM | POA: Diagnosis not present

## 2014-10-13 DIAGNOSIS — Z881 Allergy status to other antibiotic agents status: Secondary | ICD-10-CM | POA: Diagnosis not present

## 2014-10-13 DIAGNOSIS — Z9089 Acquired absence of other organs: Secondary | ICD-10-CM | POA: Diagnosis not present

## 2014-10-13 DIAGNOSIS — I10 Essential (primary) hypertension: Secondary | ICD-10-CM | POA: Diagnosis not present

## 2014-10-13 DIAGNOSIS — Z9049 Acquired absence of other specified parts of digestive tract: Secondary | ICD-10-CM | POA: Diagnosis not present

## 2014-10-13 DIAGNOSIS — S81812A Laceration without foreign body, left lower leg, initial encounter: Secondary | ICD-10-CM | POA: Diagnosis not present

## 2014-10-13 DIAGNOSIS — Z79899 Other long term (current) drug therapy: Secondary | ICD-10-CM | POA: Diagnosis not present

## 2014-10-13 DIAGNOSIS — Z90710 Acquired absence of both cervix and uterus: Secondary | ICD-10-CM | POA: Diagnosis not present

## 2014-10-13 DIAGNOSIS — Z882 Allergy status to sulfonamides status: Secondary | ICD-10-CM | POA: Diagnosis not present

## 2014-10-21 DIAGNOSIS — Z882 Allergy status to sulfonamides status: Secondary | ICD-10-CM | POA: Diagnosis not present

## 2014-10-21 DIAGNOSIS — I10 Essential (primary) hypertension: Secondary | ICD-10-CM | POA: Diagnosis not present

## 2014-10-21 DIAGNOSIS — Z881 Allergy status to other antibiotic agents status: Secondary | ICD-10-CM | POA: Diagnosis not present

## 2014-10-21 DIAGNOSIS — S81812A Laceration without foreign body, left lower leg, initial encounter: Secondary | ICD-10-CM | POA: Diagnosis not present

## 2014-10-21 DIAGNOSIS — Z9889 Other specified postprocedural states: Secondary | ICD-10-CM | POA: Diagnosis not present

## 2014-10-21 DIAGNOSIS — E0590 Thyrotoxicosis, unspecified without thyrotoxic crisis or storm: Secondary | ICD-10-CM | POA: Diagnosis not present

## 2014-11-03 DIAGNOSIS — I10 Essential (primary) hypertension: Secondary | ICD-10-CM | POA: Diagnosis not present

## 2014-11-03 DIAGNOSIS — E0590 Thyrotoxicosis, unspecified without thyrotoxic crisis or storm: Secondary | ICD-10-CM | POA: Diagnosis not present

## 2014-11-03 DIAGNOSIS — Z881 Allergy status to other antibiotic agents status: Secondary | ICD-10-CM | POA: Diagnosis not present

## 2014-11-03 DIAGNOSIS — Z882 Allergy status to sulfonamides status: Secondary | ICD-10-CM | POA: Diagnosis not present

## 2014-11-03 DIAGNOSIS — Z9889 Other specified postprocedural states: Secondary | ICD-10-CM | POA: Diagnosis not present

## 2014-11-03 DIAGNOSIS — S81812A Laceration without foreign body, left lower leg, initial encounter: Secondary | ICD-10-CM | POA: Diagnosis not present

## 2014-11-06 ENCOUNTER — Ambulatory Visit (INDEPENDENT_AMBULATORY_CARE_PROVIDER_SITE_OTHER): Payer: Medicare Other | Admitting: Otolaryngology

## 2014-11-06 DIAGNOSIS — H6123 Impacted cerumen, bilateral: Secondary | ICD-10-CM

## 2014-11-06 DIAGNOSIS — H93293 Other abnormal auditory perceptions, bilateral: Secondary | ICD-10-CM

## 2014-11-24 DIAGNOSIS — I10 Essential (primary) hypertension: Secondary | ICD-10-CM | POA: Diagnosis not present

## 2014-11-24 DIAGNOSIS — Z882 Allergy status to sulfonamides status: Secondary | ICD-10-CM | POA: Diagnosis not present

## 2014-11-24 DIAGNOSIS — Z9889 Other specified postprocedural states: Secondary | ICD-10-CM | POA: Diagnosis not present

## 2014-11-24 DIAGNOSIS — Z881 Allergy status to other antibiotic agents status: Secondary | ICD-10-CM | POA: Diagnosis not present

## 2014-11-24 DIAGNOSIS — S81812A Laceration without foreign body, left lower leg, initial encounter: Secondary | ICD-10-CM | POA: Diagnosis not present

## 2014-11-24 DIAGNOSIS — E0590 Thyrotoxicosis, unspecified without thyrotoxic crisis or storm: Secondary | ICD-10-CM | POA: Diagnosis not present

## 2014-11-24 DIAGNOSIS — S81812D Laceration without foreign body, left lower leg, subsequent encounter: Secondary | ICD-10-CM | POA: Diagnosis not present

## 2014-11-24 DIAGNOSIS — S51812D Laceration without foreign body of left forearm, subsequent encounter: Secondary | ICD-10-CM | POA: Diagnosis not present

## 2014-11-25 DIAGNOSIS — E1122 Type 2 diabetes mellitus with diabetic chronic kidney disease: Secondary | ICD-10-CM | POA: Diagnosis not present

## 2014-11-25 DIAGNOSIS — E782 Mixed hyperlipidemia: Secondary | ICD-10-CM | POA: Diagnosis not present

## 2014-11-25 DIAGNOSIS — I10 Essential (primary) hypertension: Secondary | ICD-10-CM | POA: Diagnosis not present

## 2014-11-25 DIAGNOSIS — E039 Hypothyroidism, unspecified: Secondary | ICD-10-CM | POA: Diagnosis not present

## 2014-11-25 DIAGNOSIS — E1165 Type 2 diabetes mellitus with hyperglycemia: Secondary | ICD-10-CM | POA: Diagnosis not present

## 2014-12-02 DIAGNOSIS — E1142 Type 2 diabetes mellitus with diabetic polyneuropathy: Secondary | ICD-10-CM | POA: Diagnosis not present

## 2014-12-02 DIAGNOSIS — K219 Gastro-esophageal reflux disease without esophagitis: Secondary | ICD-10-CM | POA: Diagnosis not present

## 2014-12-02 DIAGNOSIS — F411 Generalized anxiety disorder: Secondary | ICD-10-CM | POA: Diagnosis not present

## 2014-12-02 DIAGNOSIS — F331 Major depressive disorder, recurrent, moderate: Secondary | ICD-10-CM | POA: Diagnosis not present

## 2014-12-02 DIAGNOSIS — E1122 Type 2 diabetes mellitus with diabetic chronic kidney disease: Secondary | ICD-10-CM | POA: Diagnosis not present

## 2014-12-02 DIAGNOSIS — I10 Essential (primary) hypertension: Secondary | ICD-10-CM | POA: Diagnosis not present

## 2014-12-02 DIAGNOSIS — N183 Chronic kidney disease, stage 3 (moderate): Secondary | ICD-10-CM | POA: Diagnosis not present

## 2014-12-15 DIAGNOSIS — Z881 Allergy status to other antibiotic agents status: Secondary | ICD-10-CM | POA: Diagnosis not present

## 2014-12-15 DIAGNOSIS — S81812D Laceration without foreign body, left lower leg, subsequent encounter: Secondary | ICD-10-CM | POA: Diagnosis not present

## 2014-12-15 DIAGNOSIS — Z9889 Other specified postprocedural states: Secondary | ICD-10-CM | POA: Diagnosis not present

## 2014-12-15 DIAGNOSIS — I10 Essential (primary) hypertension: Secondary | ICD-10-CM | POA: Diagnosis not present

## 2014-12-15 DIAGNOSIS — Z882 Allergy status to sulfonamides status: Secondary | ICD-10-CM | POA: Diagnosis not present

## 2014-12-15 DIAGNOSIS — S81812A Laceration without foreign body, left lower leg, initial encounter: Secondary | ICD-10-CM | POA: Diagnosis not present

## 2014-12-15 DIAGNOSIS — S51812D Laceration without foreign body of left forearm, subsequent encounter: Secondary | ICD-10-CM | POA: Diagnosis not present

## 2014-12-28 DIAGNOSIS — F332 Major depressive disorder, recurrent severe without psychotic features: Secondary | ICD-10-CM | POA: Diagnosis not present

## 2015-01-08 ENCOUNTER — Ambulatory Visit (INDEPENDENT_AMBULATORY_CARE_PROVIDER_SITE_OTHER): Payer: Medicare Other | Admitting: Otolaryngology

## 2015-01-08 DIAGNOSIS — H6123 Impacted cerumen, bilateral: Secondary | ICD-10-CM

## 2015-01-27 DIAGNOSIS — F411 Generalized anxiety disorder: Secondary | ICD-10-CM | POA: Diagnosis not present

## 2015-01-27 DIAGNOSIS — Z23 Encounter for immunization: Secondary | ICD-10-CM | POA: Diagnosis not present

## 2015-01-27 DIAGNOSIS — F332 Major depressive disorder, recurrent severe without psychotic features: Secondary | ICD-10-CM | POA: Diagnosis not present

## 2015-03-02 DIAGNOSIS — J0101 Acute recurrent maxillary sinusitis: Secondary | ICD-10-CM | POA: Diagnosis not present

## 2015-03-02 DIAGNOSIS — E782 Mixed hyperlipidemia: Secondary | ICD-10-CM | POA: Diagnosis not present

## 2015-03-02 DIAGNOSIS — E1122 Type 2 diabetes mellitus with diabetic chronic kidney disease: Secondary | ICD-10-CM | POA: Diagnosis not present

## 2015-03-02 DIAGNOSIS — E039 Hypothyroidism, unspecified: Secondary | ICD-10-CM | POA: Diagnosis not present

## 2015-03-02 DIAGNOSIS — F332 Major depressive disorder, recurrent severe without psychotic features: Secondary | ICD-10-CM | POA: Diagnosis not present

## 2015-03-02 DIAGNOSIS — F411 Generalized anxiety disorder: Secondary | ICD-10-CM | POA: Diagnosis not present

## 2015-03-05 ENCOUNTER — Ambulatory Visit (INDEPENDENT_AMBULATORY_CARE_PROVIDER_SITE_OTHER): Payer: Medicare Other | Admitting: Otolaryngology

## 2015-03-05 DIAGNOSIS — H6123 Impacted cerumen, bilateral: Secondary | ICD-10-CM | POA: Diagnosis not present

## 2015-04-06 DIAGNOSIS — F324 Major depressive disorder, single episode, in partial remission: Secondary | ICD-10-CM | POA: Diagnosis not present

## 2015-04-06 DIAGNOSIS — K210 Gastro-esophageal reflux disease with esophagitis: Secondary | ICD-10-CM | POA: Diagnosis not present

## 2015-04-06 DIAGNOSIS — I10 Essential (primary) hypertension: Secondary | ICD-10-CM | POA: Diagnosis not present

## 2015-04-06 DIAGNOSIS — E039 Hypothyroidism, unspecified: Secondary | ICD-10-CM | POA: Diagnosis not present

## 2015-04-06 DIAGNOSIS — E782 Mixed hyperlipidemia: Secondary | ICD-10-CM | POA: Diagnosis not present

## 2015-04-06 DIAGNOSIS — E1122 Type 2 diabetes mellitus with diabetic chronic kidney disease: Secondary | ICD-10-CM | POA: Diagnosis not present

## 2015-04-09 DIAGNOSIS — F411 Generalized anxiety disorder: Secondary | ICD-10-CM | POA: Diagnosis not present

## 2015-04-09 DIAGNOSIS — N183 Chronic kidney disease, stage 3 (moderate): Secondary | ICD-10-CM | POA: Diagnosis not present

## 2015-04-09 DIAGNOSIS — Z9189 Other specified personal risk factors, not elsewhere classified: Secondary | ICD-10-CM | POA: Diagnosis not present

## 2015-04-09 DIAGNOSIS — E1122 Type 2 diabetes mellitus with diabetic chronic kidney disease: Secondary | ICD-10-CM | POA: Diagnosis not present

## 2015-04-09 DIAGNOSIS — E782 Mixed hyperlipidemia: Secondary | ICD-10-CM | POA: Diagnosis not present

## 2015-04-09 DIAGNOSIS — E039 Hypothyroidism, unspecified: Secondary | ICD-10-CM | POA: Diagnosis not present

## 2015-04-09 DIAGNOSIS — Z1389 Encounter for screening for other disorder: Secondary | ICD-10-CM | POA: Diagnosis not present

## 2015-04-09 DIAGNOSIS — E1142 Type 2 diabetes mellitus with diabetic polyneuropathy: Secondary | ICD-10-CM | POA: Diagnosis not present

## 2015-04-09 DIAGNOSIS — F332 Major depressive disorder, recurrent severe without psychotic features: Secondary | ICD-10-CM | POA: Diagnosis not present

## 2015-04-09 DIAGNOSIS — K219 Gastro-esophageal reflux disease without esophagitis: Secondary | ICD-10-CM | POA: Diagnosis not present

## 2015-04-09 DIAGNOSIS — J301 Allergic rhinitis due to pollen: Secondary | ICD-10-CM | POA: Diagnosis not present

## 2015-04-09 DIAGNOSIS — I10 Essential (primary) hypertension: Secondary | ICD-10-CM | POA: Diagnosis not present

## 2015-04-23 DIAGNOSIS — J069 Acute upper respiratory infection, unspecified: Secondary | ICD-10-CM | POA: Diagnosis not present

## 2015-04-23 DIAGNOSIS — R197 Diarrhea, unspecified: Secondary | ICD-10-CM | POA: Diagnosis not present

## 2015-04-23 DIAGNOSIS — R1110 Vomiting, unspecified: Secondary | ICD-10-CM | POA: Diagnosis not present

## 2015-04-27 DIAGNOSIS — K580 Irritable bowel syndrome with diarrhea: Secondary | ICD-10-CM | POA: Diagnosis not present

## 2015-04-27 DIAGNOSIS — J40 Bronchitis, not specified as acute or chronic: Secondary | ICD-10-CM | POA: Diagnosis not present

## 2015-05-07 ENCOUNTER — Ambulatory Visit (INDEPENDENT_AMBULATORY_CARE_PROVIDER_SITE_OTHER): Payer: Medicare Other | Admitting: Otolaryngology

## 2015-05-07 DIAGNOSIS — H6123 Impacted cerumen, bilateral: Secondary | ICD-10-CM | POA: Diagnosis not present

## 2015-06-08 DIAGNOSIS — K580 Irritable bowel syndrome with diarrhea: Secondary | ICD-10-CM | POA: Diagnosis not present

## 2015-06-08 DIAGNOSIS — I10 Essential (primary) hypertension: Secondary | ICD-10-CM | POA: Diagnosis not present

## 2015-06-08 DIAGNOSIS — N183 Chronic kidney disease, stage 3 (moderate): Secondary | ICD-10-CM | POA: Diagnosis not present

## 2015-06-08 DIAGNOSIS — E039 Hypothyroidism, unspecified: Secondary | ICD-10-CM | POA: Diagnosis not present

## 2015-06-08 DIAGNOSIS — K219 Gastro-esophageal reflux disease without esophagitis: Secondary | ICD-10-CM | POA: Diagnosis not present

## 2015-06-08 DIAGNOSIS — E1122 Type 2 diabetes mellitus with diabetic chronic kidney disease: Secondary | ICD-10-CM | POA: Diagnosis not present

## 2015-06-08 DIAGNOSIS — F332 Major depressive disorder, recurrent severe without psychotic features: Secondary | ICD-10-CM | POA: Diagnosis not present

## 2015-06-08 DIAGNOSIS — F411 Generalized anxiety disorder: Secondary | ICD-10-CM | POA: Diagnosis not present

## 2015-06-08 DIAGNOSIS — E782 Mixed hyperlipidemia: Secondary | ICD-10-CM | POA: Diagnosis not present

## 2015-06-08 DIAGNOSIS — J301 Allergic rhinitis due to pollen: Secondary | ICD-10-CM | POA: Diagnosis not present

## 2015-07-09 ENCOUNTER — Ambulatory Visit (INDEPENDENT_AMBULATORY_CARE_PROVIDER_SITE_OTHER): Payer: Medicare Other | Admitting: Otolaryngology

## 2015-07-09 DIAGNOSIS — H6123 Impacted cerumen, bilateral: Secondary | ICD-10-CM

## 2015-08-05 DIAGNOSIS — E039 Hypothyroidism, unspecified: Secondary | ICD-10-CM | POA: Diagnosis not present

## 2015-08-05 DIAGNOSIS — E782 Mixed hyperlipidemia: Secondary | ICD-10-CM | POA: Diagnosis not present

## 2015-08-05 DIAGNOSIS — F332 Major depressive disorder, recurrent severe without psychotic features: Secondary | ICD-10-CM | POA: Diagnosis not present

## 2015-08-05 DIAGNOSIS — E1122 Type 2 diabetes mellitus with diabetic chronic kidney disease: Secondary | ICD-10-CM | POA: Diagnosis not present

## 2015-08-05 DIAGNOSIS — I10 Essential (primary) hypertension: Secondary | ICD-10-CM | POA: Diagnosis not present

## 2015-08-05 DIAGNOSIS — Z1212 Encounter for screening for malignant neoplasm of rectum: Secondary | ICD-10-CM | POA: Diagnosis not present

## 2015-08-05 DIAGNOSIS — J301 Allergic rhinitis due to pollen: Secondary | ICD-10-CM | POA: Diagnosis not present

## 2015-08-05 DIAGNOSIS — F411 Generalized anxiety disorder: Secondary | ICD-10-CM | POA: Diagnosis not present

## 2015-09-10 ENCOUNTER — Ambulatory Visit (INDEPENDENT_AMBULATORY_CARE_PROVIDER_SITE_OTHER): Payer: Medicare Other | Admitting: Otolaryngology

## 2015-09-10 DIAGNOSIS — H6123 Impacted cerumen, bilateral: Secondary | ICD-10-CM

## 2015-09-18 DIAGNOSIS — E1122 Type 2 diabetes mellitus with diabetic chronic kidney disease: Secondary | ICD-10-CM | POA: Diagnosis not present

## 2015-09-18 DIAGNOSIS — F331 Major depressive disorder, recurrent, moderate: Secondary | ICD-10-CM | POA: Diagnosis not present

## 2015-09-18 DIAGNOSIS — E039 Hypothyroidism, unspecified: Secondary | ICD-10-CM | POA: Diagnosis not present

## 2015-09-29 DIAGNOSIS — Y9389 Activity, other specified: Secondary | ICD-10-CM | POA: Diagnosis not present

## 2015-09-29 DIAGNOSIS — F419 Anxiety disorder, unspecified: Secondary | ICD-10-CM | POA: Diagnosis not present

## 2015-09-29 DIAGNOSIS — E785 Hyperlipidemia, unspecified: Secondary | ICD-10-CM | POA: Diagnosis not present

## 2015-09-29 DIAGNOSIS — S8011XA Contusion of right lower leg, initial encounter: Secondary | ICD-10-CM | POA: Diagnosis not present

## 2015-09-29 DIAGNOSIS — Z888 Allergy status to other drugs, medicaments and biological substances status: Secondary | ICD-10-CM | POA: Diagnosis not present

## 2015-09-29 DIAGNOSIS — Z882 Allergy status to sulfonamides status: Secondary | ICD-10-CM | POA: Diagnosis not present

## 2015-09-29 DIAGNOSIS — S81811A Laceration without foreign body, right lower leg, initial encounter: Secondary | ICD-10-CM | POA: Diagnosis not present

## 2015-09-29 DIAGNOSIS — Z79899 Other long term (current) drug therapy: Secondary | ICD-10-CM | POA: Diagnosis not present

## 2015-09-29 DIAGNOSIS — Z885 Allergy status to narcotic agent status: Secondary | ICD-10-CM | POA: Diagnosis not present

## 2015-10-07 DIAGNOSIS — S161XXA Strain of muscle, fascia and tendon at neck level, initial encounter: Secondary | ICD-10-CM | POA: Diagnosis not present

## 2015-10-07 DIAGNOSIS — S81811A Laceration without foreign body, right lower leg, initial encounter: Secondary | ICD-10-CM | POA: Diagnosis not present

## 2015-10-15 DIAGNOSIS — S161XXD Strain of muscle, fascia and tendon at neck level, subsequent encounter: Secondary | ICD-10-CM | POA: Diagnosis not present

## 2015-10-15 DIAGNOSIS — S81811D Laceration without foreign body, right lower leg, subsequent encounter: Secondary | ICD-10-CM | POA: Diagnosis not present

## 2015-10-21 DIAGNOSIS — I10 Essential (primary) hypertension: Secondary | ICD-10-CM | POA: Diagnosis not present

## 2015-10-21 DIAGNOSIS — K219 Gastro-esophageal reflux disease without esophagitis: Secondary | ICD-10-CM | POA: Diagnosis not present

## 2015-10-21 DIAGNOSIS — E039 Hypothyroidism, unspecified: Secondary | ICD-10-CM | POA: Diagnosis not present

## 2015-10-21 DIAGNOSIS — N183 Chronic kidney disease, stage 3 (moderate): Secondary | ICD-10-CM | POA: Diagnosis not present

## 2015-10-21 DIAGNOSIS — E782 Mixed hyperlipidemia: Secondary | ICD-10-CM | POA: Diagnosis not present

## 2015-10-21 DIAGNOSIS — E1165 Type 2 diabetes mellitus with hyperglycemia: Secondary | ICD-10-CM | POA: Diagnosis not present

## 2015-10-26 DIAGNOSIS — J301 Allergic rhinitis due to pollen: Secondary | ICD-10-CM | POA: Diagnosis not present

## 2015-10-26 DIAGNOSIS — F411 Generalized anxiety disorder: Secondary | ICD-10-CM | POA: Diagnosis not present

## 2015-10-26 DIAGNOSIS — I10 Essential (primary) hypertension: Secondary | ICD-10-CM | POA: Diagnosis not present

## 2015-10-26 DIAGNOSIS — E039 Hypothyroidism, unspecified: Secondary | ICD-10-CM | POA: Diagnosis not present

## 2015-10-26 DIAGNOSIS — E782 Mixed hyperlipidemia: Secondary | ICD-10-CM | POA: Diagnosis not present

## 2015-10-26 DIAGNOSIS — F332 Major depressive disorder, recurrent severe without psychotic features: Secondary | ICD-10-CM | POA: Diagnosis not present

## 2015-10-26 DIAGNOSIS — E1122 Type 2 diabetes mellitus with diabetic chronic kidney disease: Secondary | ICD-10-CM | POA: Diagnosis not present

## 2015-10-26 DIAGNOSIS — Z6821 Body mass index (BMI) 21.0-21.9, adult: Secondary | ICD-10-CM | POA: Diagnosis not present

## 2015-11-04 DIAGNOSIS — Z6821 Body mass index (BMI) 21.0-21.9, adult: Secondary | ICD-10-CM | POA: Diagnosis not present

## 2015-11-04 DIAGNOSIS — S161XXD Strain of muscle, fascia and tendon at neck level, subsequent encounter: Secondary | ICD-10-CM | POA: Diagnosis not present

## 2015-11-04 DIAGNOSIS — S81811D Laceration without foreign body, right lower leg, subsequent encounter: Secondary | ICD-10-CM | POA: Diagnosis not present

## 2015-11-12 ENCOUNTER — Ambulatory Visit (INDEPENDENT_AMBULATORY_CARE_PROVIDER_SITE_OTHER): Payer: Medicare Other | Admitting: Otolaryngology

## 2015-11-12 DIAGNOSIS — H6123 Impacted cerumen, bilateral: Secondary | ICD-10-CM

## 2015-12-11 ENCOUNTER — Other Ambulatory Visit: Payer: Self-pay

## 2016-01-13 DIAGNOSIS — M19011 Primary osteoarthritis, right shoulder: Secondary | ICD-10-CM | POA: Diagnosis not present

## 2016-01-13 DIAGNOSIS — Z6821 Body mass index (BMI) 21.0-21.9, adult: Secondary | ICD-10-CM | POA: Diagnosis not present

## 2016-01-13 DIAGNOSIS — M7551 Bursitis of right shoulder: Secondary | ICD-10-CM | POA: Diagnosis not present

## 2016-01-14 ENCOUNTER — Ambulatory Visit (INDEPENDENT_AMBULATORY_CARE_PROVIDER_SITE_OTHER): Payer: Medicare Other | Admitting: Otolaryngology

## 2016-01-14 DIAGNOSIS — H6123 Impacted cerumen, bilateral: Secondary | ICD-10-CM | POA: Diagnosis not present

## 2016-01-27 DIAGNOSIS — Z6821 Body mass index (BMI) 21.0-21.9, adult: Secondary | ICD-10-CM | POA: Diagnosis not present

## 2016-01-27 DIAGNOSIS — Z23 Encounter for immunization: Secondary | ICD-10-CM | POA: Diagnosis not present

## 2016-01-27 DIAGNOSIS — M7551 Bursitis of right shoulder: Secondary | ICD-10-CM | POA: Diagnosis not present

## 2016-02-22 DIAGNOSIS — E039 Hypothyroidism, unspecified: Secondary | ICD-10-CM | POA: Diagnosis not present

## 2016-02-22 DIAGNOSIS — E1122 Type 2 diabetes mellitus with diabetic chronic kidney disease: Secondary | ICD-10-CM | POA: Diagnosis not present

## 2016-02-22 DIAGNOSIS — K210 Gastro-esophageal reflux disease with esophagitis: Secondary | ICD-10-CM | POA: Diagnosis not present

## 2016-02-22 DIAGNOSIS — E782 Mixed hyperlipidemia: Secondary | ICD-10-CM | POA: Diagnosis not present

## 2016-02-22 DIAGNOSIS — E1142 Type 2 diabetes mellitus with diabetic polyneuropathy: Secondary | ICD-10-CM | POA: Diagnosis not present

## 2016-02-22 DIAGNOSIS — I10 Essential (primary) hypertension: Secondary | ICD-10-CM | POA: Diagnosis not present

## 2016-02-24 DIAGNOSIS — I10 Essential (primary) hypertension: Secondary | ICD-10-CM | POA: Diagnosis not present

## 2016-02-24 DIAGNOSIS — Z6821 Body mass index (BMI) 21.0-21.9, adult: Secondary | ICD-10-CM | POA: Diagnosis not present

## 2016-02-24 DIAGNOSIS — F332 Major depressive disorder, recurrent severe without psychotic features: Secondary | ICD-10-CM | POA: Diagnosis not present

## 2016-02-24 DIAGNOSIS — F411 Generalized anxiety disorder: Secondary | ICD-10-CM | POA: Diagnosis not present

## 2016-02-24 DIAGNOSIS — E782 Mixed hyperlipidemia: Secondary | ICD-10-CM | POA: Diagnosis not present

## 2016-02-24 DIAGNOSIS — E039 Hypothyroidism, unspecified: Secondary | ICD-10-CM | POA: Diagnosis not present

## 2016-02-24 DIAGNOSIS — E1122 Type 2 diabetes mellitus with diabetic chronic kidney disease: Secondary | ICD-10-CM | POA: Diagnosis not present

## 2016-02-24 DIAGNOSIS — Z0001 Encounter for general adult medical examination with abnormal findings: Secondary | ICD-10-CM | POA: Diagnosis not present

## 2016-03-08 DIAGNOSIS — J209 Acute bronchitis, unspecified: Secondary | ICD-10-CM | POA: Diagnosis not present

## 2016-03-08 DIAGNOSIS — Z6820 Body mass index (BMI) 20.0-20.9, adult: Secondary | ICD-10-CM | POA: Diagnosis not present

## 2016-03-08 DIAGNOSIS — K580 Irritable bowel syndrome with diarrhea: Secondary | ICD-10-CM | POA: Diagnosis not present

## 2016-03-17 ENCOUNTER — Ambulatory Visit (INDEPENDENT_AMBULATORY_CARE_PROVIDER_SITE_OTHER): Payer: Medicare Other | Admitting: Otolaryngology

## 2016-03-17 DIAGNOSIS — H6123 Impacted cerumen, bilateral: Secondary | ICD-10-CM

## 2016-05-23 DIAGNOSIS — E782 Mixed hyperlipidemia: Secondary | ICD-10-CM | POA: Diagnosis not present

## 2016-05-23 DIAGNOSIS — E1165 Type 2 diabetes mellitus with hyperglycemia: Secondary | ICD-10-CM | POA: Diagnosis not present

## 2016-05-23 DIAGNOSIS — E039 Hypothyroidism, unspecified: Secondary | ICD-10-CM | POA: Diagnosis not present

## 2016-05-23 DIAGNOSIS — E1122 Type 2 diabetes mellitus with diabetic chronic kidney disease: Secondary | ICD-10-CM | POA: Diagnosis not present

## 2016-05-23 DIAGNOSIS — K210 Gastro-esophageal reflux disease with esophagitis: Secondary | ICD-10-CM | POA: Diagnosis not present

## 2016-05-23 DIAGNOSIS — I10 Essential (primary) hypertension: Secondary | ICD-10-CM | POA: Diagnosis not present

## 2016-05-27 DIAGNOSIS — E039 Hypothyroidism, unspecified: Secondary | ICD-10-CM | POA: Diagnosis not present

## 2016-05-27 DIAGNOSIS — F332 Major depressive disorder, recurrent severe without psychotic features: Secondary | ICD-10-CM | POA: Diagnosis not present

## 2016-05-27 DIAGNOSIS — E1122 Type 2 diabetes mellitus with diabetic chronic kidney disease: Secondary | ICD-10-CM | POA: Diagnosis not present

## 2016-05-27 DIAGNOSIS — J0101 Acute recurrent maxillary sinusitis: Secondary | ICD-10-CM | POA: Diagnosis not present

## 2016-05-27 DIAGNOSIS — J301 Allergic rhinitis due to pollen: Secondary | ICD-10-CM | POA: Diagnosis not present

## 2016-05-27 DIAGNOSIS — E782 Mixed hyperlipidemia: Secondary | ICD-10-CM | POA: Diagnosis not present

## 2016-05-27 DIAGNOSIS — F411 Generalized anxiety disorder: Secondary | ICD-10-CM | POA: Diagnosis not present

## 2016-05-27 DIAGNOSIS — I10 Essential (primary) hypertension: Secondary | ICD-10-CM | POA: Diagnosis not present

## 2016-06-02 DIAGNOSIS — L609 Nail disorder, unspecified: Secondary | ICD-10-CM | POA: Diagnosis not present

## 2016-06-02 DIAGNOSIS — M79671 Pain in right foot: Secondary | ICD-10-CM | POA: Diagnosis not present

## 2016-06-02 DIAGNOSIS — M205X1 Other deformities of toe(s) (acquired), right foot: Secondary | ICD-10-CM | POA: Diagnosis not present

## 2016-06-13 ENCOUNTER — Ambulatory Visit (INDEPENDENT_AMBULATORY_CARE_PROVIDER_SITE_OTHER): Payer: Medicare Other | Admitting: Otolaryngology

## 2016-06-13 DIAGNOSIS — H6123 Impacted cerumen, bilateral: Secondary | ICD-10-CM | POA: Diagnosis not present

## 2016-07-13 DIAGNOSIS — Z6821 Body mass index (BMI) 21.0-21.9, adult: Secondary | ICD-10-CM | POA: Diagnosis not present

## 2016-07-13 DIAGNOSIS — F331 Major depressive disorder, recurrent, moderate: Secondary | ICD-10-CM | POA: Diagnosis not present

## 2016-07-13 DIAGNOSIS — M1711 Unilateral primary osteoarthritis, right knee: Secondary | ICD-10-CM | POA: Diagnosis not present

## 2016-08-04 ENCOUNTER — Ambulatory Visit (INDEPENDENT_AMBULATORY_CARE_PROVIDER_SITE_OTHER): Payer: Medicare Other | Admitting: Otolaryngology

## 2016-08-04 DIAGNOSIS — H6123 Impacted cerumen, bilateral: Secondary | ICD-10-CM

## 2016-08-11 DIAGNOSIS — M79671 Pain in right foot: Secondary | ICD-10-CM | POA: Diagnosis not present

## 2016-08-11 DIAGNOSIS — E1140 Type 2 diabetes mellitus with diabetic neuropathy, unspecified: Secondary | ICD-10-CM | POA: Diagnosis not present

## 2016-08-11 DIAGNOSIS — M79672 Pain in left foot: Secondary | ICD-10-CM | POA: Diagnosis not present

## 2016-08-11 DIAGNOSIS — B351 Tinea unguium: Secondary | ICD-10-CM | POA: Diagnosis not present

## 2016-08-18 DIAGNOSIS — Z1212 Encounter for screening for malignant neoplasm of rectum: Secondary | ICD-10-CM | POA: Diagnosis not present

## 2016-08-18 DIAGNOSIS — E782 Mixed hyperlipidemia: Secondary | ICD-10-CM | POA: Diagnosis not present

## 2016-08-18 DIAGNOSIS — E039 Hypothyroidism, unspecified: Secondary | ICD-10-CM | POA: Diagnosis not present

## 2016-08-18 DIAGNOSIS — Z1389 Encounter for screening for other disorder: Secondary | ICD-10-CM | POA: Diagnosis not present

## 2016-08-18 DIAGNOSIS — M1711 Unilateral primary osteoarthritis, right knee: Secondary | ICD-10-CM | POA: Diagnosis not present

## 2016-08-18 DIAGNOSIS — E1122 Type 2 diabetes mellitus with diabetic chronic kidney disease: Secondary | ICD-10-CM | POA: Diagnosis not present

## 2016-08-18 DIAGNOSIS — Z6821 Body mass index (BMI) 21.0-21.9, adult: Secondary | ICD-10-CM | POA: Diagnosis not present

## 2016-08-18 DIAGNOSIS — I10 Essential (primary) hypertension: Secondary | ICD-10-CM | POA: Diagnosis not present

## 2016-08-29 DIAGNOSIS — J0101 Acute recurrent maxillary sinusitis: Secondary | ICD-10-CM | POA: Diagnosis not present

## 2016-08-29 DIAGNOSIS — Z6820 Body mass index (BMI) 20.0-20.9, adult: Secondary | ICD-10-CM | POA: Diagnosis not present

## 2016-10-05 DIAGNOSIS — J209 Acute bronchitis, unspecified: Secondary | ICD-10-CM | POA: Diagnosis not present

## 2016-10-05 DIAGNOSIS — J301 Allergic rhinitis due to pollen: Secondary | ICD-10-CM | POA: Diagnosis not present

## 2016-10-05 DIAGNOSIS — Z6820 Body mass index (BMI) 20.0-20.9, adult: Secondary | ICD-10-CM | POA: Diagnosis not present

## 2016-10-13 ENCOUNTER — Ambulatory Visit (INDEPENDENT_AMBULATORY_CARE_PROVIDER_SITE_OTHER): Payer: Medicare Other | Admitting: Otolaryngology

## 2016-10-13 DIAGNOSIS — H6123 Impacted cerumen, bilateral: Secondary | ICD-10-CM | POA: Diagnosis not present

## 2016-11-07 DIAGNOSIS — L03116 Cellulitis of left lower limb: Secondary | ICD-10-CM | POA: Diagnosis not present

## 2016-11-07 DIAGNOSIS — Z6820 Body mass index (BMI) 20.0-20.9, adult: Secondary | ICD-10-CM | POA: Diagnosis not present

## 2016-11-10 DIAGNOSIS — B351 Tinea unguium: Secondary | ICD-10-CM | POA: Diagnosis not present

## 2016-11-10 DIAGNOSIS — M79672 Pain in left foot: Secondary | ICD-10-CM | POA: Diagnosis not present

## 2016-11-10 DIAGNOSIS — M79671 Pain in right foot: Secondary | ICD-10-CM | POA: Diagnosis not present

## 2016-11-10 DIAGNOSIS — E1140 Type 2 diabetes mellitus with diabetic neuropathy, unspecified: Secondary | ICD-10-CM | POA: Diagnosis not present

## 2016-11-16 DIAGNOSIS — E1122 Type 2 diabetes mellitus with diabetic chronic kidney disease: Secondary | ICD-10-CM | POA: Diagnosis not present

## 2016-11-16 DIAGNOSIS — Z9189 Other specified personal risk factors, not elsewhere classified: Secondary | ICD-10-CM | POA: Diagnosis not present

## 2016-11-16 DIAGNOSIS — E782 Mixed hyperlipidemia: Secondary | ICD-10-CM | POA: Diagnosis not present

## 2016-11-16 DIAGNOSIS — E039 Hypothyroidism, unspecified: Secondary | ICD-10-CM | POA: Diagnosis not present

## 2016-11-16 DIAGNOSIS — E1165 Type 2 diabetes mellitus with hyperglycemia: Secondary | ICD-10-CM | POA: Diagnosis not present

## 2016-11-16 DIAGNOSIS — I10 Essential (primary) hypertension: Secondary | ICD-10-CM | POA: Diagnosis not present

## 2016-11-16 DIAGNOSIS — K210 Gastro-esophageal reflux disease with esophagitis: Secondary | ICD-10-CM | POA: Diagnosis not present

## 2016-11-21 DIAGNOSIS — E039 Hypothyroidism, unspecified: Secondary | ICD-10-CM | POA: Diagnosis not present

## 2016-11-21 DIAGNOSIS — J301 Allergic rhinitis due to pollen: Secondary | ICD-10-CM | POA: Diagnosis not present

## 2016-11-21 DIAGNOSIS — N183 Chronic kidney disease, stage 3 (moderate): Secondary | ICD-10-CM | POA: Diagnosis not present

## 2016-11-21 DIAGNOSIS — Z6820 Body mass index (BMI) 20.0-20.9, adult: Secondary | ICD-10-CM | POA: Diagnosis not present

## 2016-11-21 DIAGNOSIS — E1122 Type 2 diabetes mellitus with diabetic chronic kidney disease: Secondary | ICD-10-CM | POA: Diagnosis not present

## 2016-11-21 DIAGNOSIS — E782 Mixed hyperlipidemia: Secondary | ICD-10-CM | POA: Diagnosis not present

## 2016-11-21 DIAGNOSIS — I10 Essential (primary) hypertension: Secondary | ICD-10-CM | POA: Diagnosis not present

## 2016-11-21 DIAGNOSIS — K580 Irritable bowel syndrome with diarrhea: Secondary | ICD-10-CM | POA: Diagnosis not present

## 2016-12-13 DIAGNOSIS — Z6820 Body mass index (BMI) 20.0-20.9, adult: Secondary | ICD-10-CM | POA: Diagnosis not present

## 2016-12-13 DIAGNOSIS — F331 Major depressive disorder, recurrent, moderate: Secondary | ICD-10-CM | POA: Diagnosis not present

## 2016-12-13 DIAGNOSIS — F411 Generalized anxiety disorder: Secondary | ICD-10-CM | POA: Diagnosis not present

## 2016-12-13 DIAGNOSIS — K580 Irritable bowel syndrome with diarrhea: Secondary | ICD-10-CM | POA: Diagnosis not present

## 2016-12-15 ENCOUNTER — Ambulatory Visit (INDEPENDENT_AMBULATORY_CARE_PROVIDER_SITE_OTHER): Payer: Medicare Other | Admitting: Otolaryngology

## 2016-12-15 DIAGNOSIS — H6123 Impacted cerumen, bilateral: Secondary | ICD-10-CM

## 2017-02-02 DIAGNOSIS — E1140 Type 2 diabetes mellitus with diabetic neuropathy, unspecified: Secondary | ICD-10-CM | POA: Diagnosis not present

## 2017-02-02 DIAGNOSIS — M79671 Pain in right foot: Secondary | ICD-10-CM | POA: Diagnosis not present

## 2017-02-02 DIAGNOSIS — B351 Tinea unguium: Secondary | ICD-10-CM | POA: Diagnosis not present

## 2017-02-02 DIAGNOSIS — M79672 Pain in left foot: Secondary | ICD-10-CM | POA: Diagnosis not present

## 2017-02-23 ENCOUNTER — Ambulatory Visit (INDEPENDENT_AMBULATORY_CARE_PROVIDER_SITE_OTHER): Payer: Medicare Other | Admitting: Otolaryngology

## 2017-02-23 DIAGNOSIS — K210 Gastro-esophageal reflux disease with esophagitis: Secondary | ICD-10-CM | POA: Diagnosis not present

## 2017-02-23 DIAGNOSIS — I10 Essential (primary) hypertension: Secondary | ICD-10-CM | POA: Diagnosis not present

## 2017-02-23 DIAGNOSIS — E1122 Type 2 diabetes mellitus with diabetic chronic kidney disease: Secondary | ICD-10-CM | POA: Diagnosis not present

## 2017-02-23 DIAGNOSIS — E782 Mixed hyperlipidemia: Secondary | ICD-10-CM | POA: Diagnosis not present

## 2017-02-23 DIAGNOSIS — Z9189 Other specified personal risk factors, not elsewhere classified: Secondary | ICD-10-CM | POA: Diagnosis not present

## 2017-02-23 DIAGNOSIS — E1142 Type 2 diabetes mellitus with diabetic polyneuropathy: Secondary | ICD-10-CM | POA: Diagnosis not present

## 2017-02-23 DIAGNOSIS — H6123 Impacted cerumen, bilateral: Secondary | ICD-10-CM | POA: Diagnosis not present

## 2017-02-23 DIAGNOSIS — N183 Chronic kidney disease, stage 3 (moderate): Secondary | ICD-10-CM | POA: Diagnosis not present

## 2017-02-23 DIAGNOSIS — E1165 Type 2 diabetes mellitus with hyperglycemia: Secondary | ICD-10-CM | POA: Diagnosis not present

## 2017-02-23 DIAGNOSIS — E039 Hypothyroidism, unspecified: Secondary | ICD-10-CM | POA: Diagnosis not present

## 2017-02-23 DIAGNOSIS — F324 Major depressive disorder, single episode, in partial remission: Secondary | ICD-10-CM | POA: Diagnosis not present

## 2017-02-27 DIAGNOSIS — E039 Hypothyroidism, unspecified: Secondary | ICD-10-CM | POA: Diagnosis not present

## 2017-02-27 DIAGNOSIS — J301 Allergic rhinitis due to pollen: Secondary | ICD-10-CM | POA: Diagnosis not present

## 2017-02-27 DIAGNOSIS — I10 Essential (primary) hypertension: Secondary | ICD-10-CM | POA: Diagnosis not present

## 2017-02-27 DIAGNOSIS — K580 Irritable bowel syndrome with diarrhea: Secondary | ICD-10-CM | POA: Diagnosis not present

## 2017-02-27 DIAGNOSIS — F332 Major depressive disorder, recurrent severe without psychotic features: Secondary | ICD-10-CM | POA: Diagnosis not present

## 2017-02-27 DIAGNOSIS — F411 Generalized anxiety disorder: Secondary | ICD-10-CM | POA: Diagnosis not present

## 2017-02-27 DIAGNOSIS — E1122 Type 2 diabetes mellitus with diabetic chronic kidney disease: Secondary | ICD-10-CM | POA: Diagnosis not present

## 2017-02-27 DIAGNOSIS — Z0001 Encounter for general adult medical examination with abnormal findings: Secondary | ICD-10-CM | POA: Diagnosis not present

## 2017-02-27 DIAGNOSIS — E782 Mixed hyperlipidemia: Secondary | ICD-10-CM | POA: Diagnosis not present

## 2017-02-27 DIAGNOSIS — Z23 Encounter for immunization: Secondary | ICD-10-CM | POA: Diagnosis not present

## 2017-02-27 DIAGNOSIS — K219 Gastro-esophageal reflux disease without esophagitis: Secondary | ICD-10-CM | POA: Diagnosis not present

## 2017-02-27 DIAGNOSIS — J209 Acute bronchitis, unspecified: Secondary | ICD-10-CM | POA: Diagnosis not present

## 2017-03-15 DIAGNOSIS — J301 Allergic rhinitis due to pollen: Secondary | ICD-10-CM | POA: Diagnosis not present

## 2017-03-15 DIAGNOSIS — Z6821 Body mass index (BMI) 21.0-21.9, adult: Secondary | ICD-10-CM | POA: Diagnosis not present

## 2017-03-15 DIAGNOSIS — S60222A Contusion of left hand, initial encounter: Secondary | ICD-10-CM | POA: Diagnosis not present

## 2017-04-20 DIAGNOSIS — M79671 Pain in right foot: Secondary | ICD-10-CM | POA: Diagnosis not present

## 2017-04-20 DIAGNOSIS — M79672 Pain in left foot: Secondary | ICD-10-CM | POA: Diagnosis not present

## 2017-04-20 DIAGNOSIS — B351 Tinea unguium: Secondary | ICD-10-CM | POA: Diagnosis not present

## 2017-04-20 DIAGNOSIS — E1140 Type 2 diabetes mellitus with diabetic neuropathy, unspecified: Secondary | ICD-10-CM | POA: Diagnosis not present

## 2017-04-27 ENCOUNTER — Ambulatory Visit (INDEPENDENT_AMBULATORY_CARE_PROVIDER_SITE_OTHER): Payer: Medicare Other | Admitting: Otolaryngology

## 2017-04-27 DIAGNOSIS — H6123 Impacted cerumen, bilateral: Secondary | ICD-10-CM | POA: Diagnosis not present

## 2017-05-29 DIAGNOSIS — Z6821 Body mass index (BMI) 21.0-21.9, adult: Secondary | ICD-10-CM | POA: Diagnosis not present

## 2017-05-29 DIAGNOSIS — E1122 Type 2 diabetes mellitus with diabetic chronic kidney disease: Secondary | ICD-10-CM | POA: Diagnosis not present

## 2017-05-29 DIAGNOSIS — E039 Hypothyroidism, unspecified: Secondary | ICD-10-CM | POA: Diagnosis not present

## 2017-05-29 DIAGNOSIS — N183 Chronic kidney disease, stage 3 (moderate): Secondary | ICD-10-CM | POA: Diagnosis not present

## 2017-05-29 DIAGNOSIS — J209 Acute bronchitis, unspecified: Secondary | ICD-10-CM | POA: Diagnosis not present

## 2017-05-29 DIAGNOSIS — K580 Irritable bowel syndrome with diarrhea: Secondary | ICD-10-CM | POA: Diagnosis not present

## 2017-05-29 DIAGNOSIS — E782 Mixed hyperlipidemia: Secondary | ICD-10-CM | POA: Diagnosis not present

## 2017-05-29 DIAGNOSIS — I10 Essential (primary) hypertension: Secondary | ICD-10-CM | POA: Diagnosis not present

## 2017-06-23 DIAGNOSIS — N183 Chronic kidney disease, stage 3 (moderate): Secondary | ICD-10-CM | POA: Diagnosis not present

## 2017-06-23 DIAGNOSIS — S41112A Laceration without foreign body of left upper arm, initial encounter: Secondary | ICD-10-CM | POA: Diagnosis not present

## 2017-06-23 DIAGNOSIS — F332 Major depressive disorder, recurrent severe without psychotic features: Secondary | ICD-10-CM | POA: Diagnosis not present

## 2017-06-23 DIAGNOSIS — E782 Mixed hyperlipidemia: Secondary | ICD-10-CM | POA: Diagnosis not present

## 2017-06-23 DIAGNOSIS — K580 Irritable bowel syndrome with diarrhea: Secondary | ICD-10-CM | POA: Diagnosis not present

## 2017-06-23 DIAGNOSIS — I10 Essential (primary) hypertension: Secondary | ICD-10-CM | POA: Diagnosis not present

## 2017-06-23 DIAGNOSIS — D519 Vitamin B12 deficiency anemia, unspecified: Secondary | ICD-10-CM | POA: Diagnosis not present

## 2017-06-23 DIAGNOSIS — F411 Generalized anxiety disorder: Secondary | ICD-10-CM | POA: Diagnosis not present

## 2017-06-23 DIAGNOSIS — E1122 Type 2 diabetes mellitus with diabetic chronic kidney disease: Secondary | ICD-10-CM | POA: Diagnosis not present

## 2017-06-23 DIAGNOSIS — R296 Repeated falls: Secondary | ICD-10-CM | POA: Diagnosis not present

## 2017-06-27 DIAGNOSIS — S41112D Laceration without foreign body of left upper arm, subsequent encounter: Secondary | ICD-10-CM | POA: Diagnosis not present

## 2017-06-27 DIAGNOSIS — R296 Repeated falls: Secondary | ICD-10-CM | POA: Diagnosis not present

## 2017-06-29 ENCOUNTER — Ambulatory Visit (INDEPENDENT_AMBULATORY_CARE_PROVIDER_SITE_OTHER): Payer: Medicare Other | Admitting: Otolaryngology

## 2017-06-29 DIAGNOSIS — H6123 Impacted cerumen, bilateral: Secondary | ICD-10-CM

## 2017-07-03 DIAGNOSIS — Z6821 Body mass index (BMI) 21.0-21.9, adult: Secondary | ICD-10-CM | POA: Diagnosis not present

## 2017-07-03 DIAGNOSIS — R296 Repeated falls: Secondary | ICD-10-CM | POA: Diagnosis not present

## 2017-07-03 DIAGNOSIS — L03114 Cellulitis of left upper limb: Secondary | ICD-10-CM | POA: Diagnosis not present

## 2017-07-03 DIAGNOSIS — M6281 Muscle weakness (generalized): Secondary | ICD-10-CM | POA: Diagnosis not present

## 2017-07-05 DIAGNOSIS — R296 Repeated falls: Secondary | ICD-10-CM | POA: Diagnosis not present

## 2017-07-05 DIAGNOSIS — M6281 Muscle weakness (generalized): Secondary | ICD-10-CM | POA: Diagnosis not present

## 2017-07-06 DIAGNOSIS — E1140 Type 2 diabetes mellitus with diabetic neuropathy, unspecified: Secondary | ICD-10-CM | POA: Diagnosis not present

## 2017-07-06 DIAGNOSIS — B351 Tinea unguium: Secondary | ICD-10-CM | POA: Diagnosis not present

## 2017-07-06 DIAGNOSIS — M79672 Pain in left foot: Secondary | ICD-10-CM | POA: Diagnosis not present

## 2017-07-06 DIAGNOSIS — M79671 Pain in right foot: Secondary | ICD-10-CM | POA: Diagnosis not present

## 2017-07-11 DIAGNOSIS — R296 Repeated falls: Secondary | ICD-10-CM | POA: Diagnosis not present

## 2017-07-11 DIAGNOSIS — M6281 Muscle weakness (generalized): Secondary | ICD-10-CM | POA: Diagnosis not present

## 2017-07-13 DIAGNOSIS — M6281 Muscle weakness (generalized): Secondary | ICD-10-CM | POA: Diagnosis not present

## 2017-07-13 DIAGNOSIS — R296 Repeated falls: Secondary | ICD-10-CM | POA: Diagnosis not present

## 2017-07-18 DIAGNOSIS — D519 Vitamin B12 deficiency anemia, unspecified: Secondary | ICD-10-CM | POA: Diagnosis not present

## 2017-07-18 DIAGNOSIS — R5383 Other fatigue: Secondary | ICD-10-CM | POA: Diagnosis not present

## 2017-07-18 DIAGNOSIS — Z6821 Body mass index (BMI) 21.0-21.9, adult: Secondary | ICD-10-CM | POA: Diagnosis not present

## 2017-07-18 DIAGNOSIS — R509 Fever, unspecified: Secondary | ICD-10-CM | POA: Diagnosis not present

## 2017-08-01 DIAGNOSIS — M6281 Muscle weakness (generalized): Secondary | ICD-10-CM | POA: Diagnosis not present

## 2017-08-01 DIAGNOSIS — R296 Repeated falls: Secondary | ICD-10-CM | POA: Diagnosis not present

## 2017-08-08 DIAGNOSIS — Z6821 Body mass index (BMI) 21.0-21.9, adult: Secondary | ICD-10-CM | POA: Diagnosis not present

## 2017-08-08 DIAGNOSIS — R5383 Other fatigue: Secondary | ICD-10-CM | POA: Diagnosis not present

## 2017-08-08 DIAGNOSIS — F329 Major depressive disorder, single episode, unspecified: Secondary | ICD-10-CM | POA: Diagnosis not present

## 2017-08-15 DIAGNOSIS — R197 Diarrhea, unspecified: Secondary | ICD-10-CM | POA: Diagnosis not present

## 2017-08-15 DIAGNOSIS — Z6821 Body mass index (BMI) 21.0-21.9, adult: Secondary | ICD-10-CM | POA: Diagnosis not present

## 2017-08-15 DIAGNOSIS — K580 Irritable bowel syndrome with diarrhea: Secondary | ICD-10-CM | POA: Diagnosis not present

## 2017-08-21 ENCOUNTER — Emergency Department (HOSPITAL_COMMUNITY)
Admission: EM | Admit: 2017-08-21 | Discharge: 2017-08-21 | Disposition: A | Discharge status: Home / Self Care | Payer: Medicare Other | Attending: Emergency Medicine | Admitting: Emergency Medicine

## 2017-08-21 ENCOUNTER — Encounter (HOSPITAL_COMMUNITY): Payer: Self-pay | Admitting: *Deleted

## 2017-08-21 ENCOUNTER — Other Ambulatory Visit: Payer: Self-pay

## 2017-08-21 ENCOUNTER — Encounter: Payer: Self-pay | Admitting: Internal Medicine

## 2017-08-21 DIAGNOSIS — Z79899 Other long term (current) drug therapy: Secondary | ICD-10-CM | POA: Diagnosis not present

## 2017-08-21 DIAGNOSIS — R197 Diarrhea, unspecified: Secondary | ICD-10-CM | POA: Insufficient documentation

## 2017-08-21 DIAGNOSIS — E119 Type 2 diabetes mellitus without complications: Secondary | ICD-10-CM | POA: Diagnosis not present

## 2017-08-21 HISTORY — DX: Type 2 diabetes mellitus without complications: E11.9

## 2017-08-21 HISTORY — DX: Disorder of thyroid, unspecified: E07.9

## 2017-08-21 LAB — CBC
HCT: 38.7 % (ref 36.0–46.0)
Hemoglobin: 13.3 g/dL (ref 12.0–15.0)
MCH: 28.5 pg (ref 26.0–34.0)
MCHC: 34.4 g/dL (ref 30.0–36.0)
MCV: 83 fL (ref 78.0–100.0)
Platelets: 155 10*3/uL (ref 150–400)
RBC: 4.66 MIL/uL (ref 3.87–5.11)
RDW: 13.7 % (ref 11.5–15.5)
WBC: 7.6 10*3/uL (ref 4.0–10.5)

## 2017-08-21 LAB — COMPREHENSIVE METABOLIC PANEL
ALBUMIN: 3.8 g/dL (ref 3.5–5.0)
ALK PHOS: 69 U/L (ref 38–126)
ALT: 18 U/L (ref 14–54)
ANION GAP: 8 (ref 5–15)
AST: 24 U/L (ref 15–41)
BILIRUBIN TOTAL: 0.6 mg/dL (ref 0.3–1.2)
BUN: 9 mg/dL (ref 6–20)
CALCIUM: 9.5 mg/dL (ref 8.9–10.3)
CO2: 28 mmol/L (ref 22–32)
Chloride: 106 mmol/L (ref 101–111)
Creatinine, Ser: 1.14 mg/dL — ABNORMAL HIGH (ref 0.44–1.00)
GFR calc Af Amer: 52 mL/min — ABNORMAL LOW (ref >60)
GFR calc non Af Amer: 45 mL/min — ABNORMAL LOW (ref >60)
GLUCOSE: 118 mg/dL — AB (ref 65–99)
Potassium: 3.9 mmol/L (ref 3.5–5.1)
Sodium: 142 mmol/L (ref 135–145)
TOTAL PROTEIN: 6.4 g/dL — AB (ref 6.5–8.1)

## 2017-08-21 LAB — URINALYSIS, ROUTINE W REFLEX MICROSCOPIC
Bilirubin Urine: NEGATIVE
GLUCOSE, UA: NEGATIVE mg/dL
Hgb urine dipstick: NEGATIVE
KETONES UR: NEGATIVE mg/dL
LEUKOCYTES UA: NEGATIVE
NITRITE: NEGATIVE
PROTEIN: NEGATIVE mg/dL
Specific Gravity, Urine: 1.013 (ref 1.005–1.030)
pH: 5 (ref 5.0–8.0)

## 2017-08-21 LAB — LIPASE, BLOOD: Lipase: 27 U/L (ref 11–51)

## 2017-08-21 LAB — TSH: TSH: 0.615 u[IU]/mL (ref 0.350–4.500)

## 2017-08-21 LAB — MAGNESIUM: MAGNESIUM: 2 mg/dL (ref 1.7–2.4)

## 2017-08-21 LAB — PHOSPHORUS: Phosphorus: 3.3 mg/dL (ref 2.5–4.6)

## 2017-08-21 NOTE — ED Triage Notes (Signed)
Pt in c/o 8 loose stools today, pt c/o nausea, hx of IBS, pt reports worsening of symptoms x 8 wks, pt A&O x4, denies blood or dark colored stools

## 2017-08-21 NOTE — ED Provider Notes (Signed)
[facility] Provider Note   CSN: [id number] Arrival date & time: 08/21/17  1113     History   Chief Complaint Chief Complaint  Patient presents with  . Abdominal Pain    HPI Ellen Cruz is a 77 y.o. female.  HPI  77 year old with history of diabetes and thyroid disorder comes in with chief complaint of diarrhea. Patient has been having diarrhea for several months now.  Patient has seen her primary care doctor for her symptoms, and he has recommended the patient see GI doctors.  Patient has an appointment in July, but she does not think she can wait.  Patient gets between 8-20 episodes of loose bowel movements that are nonbloody now.  Patient has no associated abdominal pain.  Patient denies any dizziness, lightheadedness but she is feeling weak.  She was on antibiotics about a month ago for a skin infection.  No recent travel history.  Past Medical History:  Diagnosis Date  . Diabetes mellitus without complication ([place])   . Thyroid disease     There are no active problems to display for this patient.   History reviewed. No pertinent surgical history.   OB History   None      Home Medications    Prior to Admission medications   Medication Sig Start Date End Date Taking? Authorizing Provider  cholecalciferol (VITAMIN D) 1000 units tablet Take 1,000 Units by mouth daily.   Yes [provider]  diazepam (VALIUM) 10 MG tablet Take 10 mg by mouth 3 (three) times daily as needed for anxiety. 08/15/17  Yes [provider]  diphenhydrAMINE (BENADRYL) 25 MG tablet Take 25 mg by mouth as needed for allergies.   Yes [provider]  hydroxypropyl methylcellulose / hypromellose (ISOPTO TEARS / GONIOVISC) 2.5 % ophthalmic solution Place 1 drop into both eyes as needed for dry eyes.   Yes [provider]  levothyroxine (SYNTHROID, LEVOTHROID) 75 MCG tablet Take 75 mcg by mouth daily. 07/19/17  Yes  [provider]  loperamide (IMODIUM) 2 MG capsule Take 4 mg by mouth as needed for diarrhea or loose stools.   Yes [provider]    Family History No family history on file.  Social History Social History   Tobacco Use  . Smoking status: Never Smoker  . Smokeless tobacco: Never Used  Substance Use Topics  . Alcohol use: Never    Frequency: Never  . Drug use: Never     Allergies   Asa [aspirin]; Codeine; Lactose intolerance (gi); Statins; Sulfa antibiotics; Lexapro [escitalopram oxalate]; Phenothiazines; and Zyrtec [cetirizine]   Review of Systems Review of Systems  Constitutional: Positive for activity change.  Respiratory: Negative for shortness of breath.   Gastrointestinal: Positive for diarrhea.  Allergic/Immunologic: Negative for immunocompromised state.  Hematological: Does not bruise/bleed easily.     Physical Exam Updated Vital Signs BP (!) 156/87   Pulse 83   Temp 98.1 F (36.7 C) (Oral)   Resp 16   SpO2 97%   Physical Exam  Constitutional: She is oriented to person, place, and time. She appears well-developed.  HENT:  Head: Normocephalic and atraumatic.  Eyes: EOM are normal.  Neck: Normal range of motion. Neck supple.  Cardiovascular: Normal rate.  Pulmonary/Chest: Effort normal.  Abdominal: Bowel sounds are normal. There is no tenderness.  Neurological: She is alert and oriented to person, place, and time.  Skin: Skin is warm and dry.  Nursing note and vitals reviewed.  ED Treatments / Results  Labs (all labs ordered are listed, but only abnormal results are displayed) Labs Reviewed  COMPREHENSIVE METABOLIC PANEL - Abnormal; Notable for the following components:      Result Value   Glucose, Bld 118 (*)    Creatinine, Ser 1.14 (*)    Total Protein 6.4 (*)    GFR calc non Af Amer 45 (*)    GFR calc Af Amer 52 (*)    All other components within normal limits  URINALYSIS, ROUTINE W REFLEX MICROSCOPIC - Abnormal;  Notable for the following components:   APPearance HAZY (*)    All other components within normal limits  GASTROINTESTINAL PANEL BY PCR, STOOL (REPLACES STOOL CULTURE)  LIPASE, BLOOD  CBC  MAGNESIUM  PHOSPHORUS  TSH    EKG None  Radiology No results found.  Procedures Procedures (including critical care time)  Medications Ordered in ED Medications - No data to display   Initial Impression / Assessment and Plan / ED Course  I have reviewed the triage vital signs and the nursing notes.  Pertinent labs & imaging results that were available during my care of the patient were reviewed by me and considered in my medical decision making (see chart for details).     77 year old comes in with chief complaint of diarrhea.  Patient has been having multiple episodes of loose bowel movement for the last several days.  Patient does not have any bloody stools and she denies any fevers.  Patient's work-up in the ER is normal.  She has been trying to get into GI doctors, and was asked to come to the ER for a ED consultation for GI.  Patient's labs are reassuring.  I do not see any reason to get GI on board -I spoke with Dr. Lyndel Safe, who will try to coordinate a prompt follow-up for the patient.  Stool studies have been sent, hopefully GI doctors can follow-up with.  Final Clinical Impressions(s) / ED Diagnoses   Final diagnoses:  Diarrhea, unspecified type    ED Discharge Orders    None       Varney Biles, MD 08/21/17 2201

## 2017-08-21 NOTE — Discharge Instructions (Addendum)
We saw you in the ER for the  All the results in the ER are normal. We are not sure what is causing your symptoms. The workup in the ER is not complete, and is limited to screening for life threatening and emergent conditions only, so please see a primary care doctor for further evaluation.  We have contacted [facility] doctors to see if they can set up a close appointment with you and they will call you with an appointment within 2 weeks.

## 2017-08-21 NOTE — ED Notes (Signed)
Pt ambulated to the bathroom with steady gait.

## 2017-08-22 ENCOUNTER — Telehealth: Payer: Self-pay

## 2017-08-22 NOTE — Telephone Encounter (Signed)
Attempted to contact patient but her voicemail is not activated.  I will attempt to call again.

## 2017-08-23 ENCOUNTER — Ambulatory Visit (INDEPENDENT_AMBULATORY_CARE_PROVIDER_SITE_OTHER): Payer: Medicare Other | Admitting: Physician Assistant

## 2017-08-23 ENCOUNTER — Other Ambulatory Visit (INDEPENDENT_AMBULATORY_CARE_PROVIDER_SITE_OTHER): Payer: Medicare Other

## 2017-08-23 ENCOUNTER — Encounter: Payer: Self-pay | Admitting: Physician Assistant

## 2017-08-23 ENCOUNTER — Telehealth: Payer: Self-pay

## 2017-08-23 VITALS — BP 136/80 | HR 84 | Ht 60.5 in | Wt 113.5 lb

## 2017-08-23 DIAGNOSIS — R197 Diarrhea, unspecified: Secondary | ICD-10-CM

## 2017-08-23 DIAGNOSIS — R531 Weakness: Secondary | ICD-10-CM | POA: Diagnosis not present

## 2017-08-23 DIAGNOSIS — K529 Noninfective gastroenteritis and colitis, unspecified: Secondary | ICD-10-CM

## 2017-08-23 LAB — HIGH SENSITIVITY CRP: CRP, High Sensitivity: 1.09 mg/L (ref 0.000–5.000)

## 2017-08-23 LAB — SEDIMENTATION RATE: SED RATE: 7 mm/h (ref 0–30)

## 2017-08-23 LAB — IGA: IgA: 79 mg/dL (ref 68–378)

## 2017-08-23 MED ORDER — GLYCOPYRROLATE 2 MG PO TABS: 2.0000 mg | ORAL_TABLET | Freq: Two times a day (BID) | ORAL | 8 refills | Status: DC
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
## 2017-08-23 NOTE — Telephone Encounter (Signed)
I spoke with her yesterday and scheduled an appt for her to see Amy E today.

## 2017-08-23 NOTE — Patient Instructions (Addendum)
Your provider has requested that you go to the basement level for lab work before leaving today. Press "B" on the elevator. The lab is located at the first door on the left as you exit the elevator. We sent a prescription to your pharmacy for Glycopyrolate 2 mg.   Lacose free diet. Avoid trigger foods, salads, etc.   Call us in 2 weeks for an appointment with Nicoletta Ba PA for The week of June 9th.   If you are age 78 or older, your body mass index should be between 23-30. Your Body mass index is 21.8 kg/m. If this is out of the aforementioned range listed, please consider follow up with your Primary Care Provider.

## 2017-08-24 ENCOUNTER — Other Ambulatory Visit: Payer: Medicare Other

## 2017-08-24 ENCOUNTER — Telehealth: Payer: Self-pay | Admitting: Physician Assistant

## 2017-08-24 DIAGNOSIS — R531 Weakness: Secondary | ICD-10-CM

## 2017-08-24 DIAGNOSIS — R197 Diarrhea, unspecified: Secondary | ICD-10-CM

## 2017-08-24 DIAGNOSIS — K52839 Microscopic colitis, unspecified: Secondary | ICD-10-CM | POA: Diagnosis not present

## 2017-08-24 LAB — TISSUE TRANSGLUTAMINASE, IGG: (tTG) Ab, IgG: 1 U/mL

## 2017-08-24 NOTE — Progress Notes (Signed)
Thank you for sending this case to me. I have reviewed the entire note, and the outlined plan seems appropriate.  If she changes her mind about a colonoscopy to look for overt or microscopic colitis, I think it would be appropriate. Caution with anti-cholinegric meds in elderly patient.  Wilfrid Lund, MD

## 2017-08-25 NOTE — Telephone Encounter (Signed)
If Robinul is too expensive -we could try bentyl 10 mg po  BID  # 60/4

## 2017-08-25 NOTE — Telephone Encounter (Signed)
Spoke with the patient yesterday 08/24/17. 35 minute conversation where the patient started her medical history from 7 years ago "when I first developed diarrhea."  Difficult to redirect the patient to the concerns or issues she was having with Robinul. She spoke of [place] telling her it was going to cost her $700. She went to a different pharmacy that she used to go to years ago. She purchased "a few pills." She is concerned about possible side effects. She spent a lot of time explaining how the diarrhea affects her and limits her ability to enjoy her daily life.  End of conversation (after 5 pm) I think she will try at least 1 Robinul. She mentions she is afraid of constipation even though she has regularly taken Imodium AD.

## 2017-08-25 NOTE — Telephone Encounter (Signed)
Noted. If she decides this is not the medication she wants due to the price, I will offer.

## 2017-08-28 DIAGNOSIS — K210 Gastro-esophageal reflux disease with esophagitis: Secondary | ICD-10-CM | POA: Diagnosis not present

## 2017-08-28 DIAGNOSIS — I10 Essential (primary) hypertension: Secondary | ICD-10-CM | POA: Diagnosis not present

## 2017-08-28 DIAGNOSIS — E109 Type 1 diabetes mellitus without complications: Secondary | ICD-10-CM | POA: Diagnosis not present

## 2017-08-28 DIAGNOSIS — R5383 Other fatigue: Secondary | ICD-10-CM | POA: Diagnosis not present

## 2017-08-30 DIAGNOSIS — K580 Irritable bowel syndrome with diarrhea: Secondary | ICD-10-CM | POA: Diagnosis not present

## 2017-08-30 DIAGNOSIS — J301 Allergic rhinitis due to pollen: Secondary | ICD-10-CM | POA: Diagnosis not present

## 2017-08-30 DIAGNOSIS — N183 Chronic kidney disease, stage 3 (moderate): Secondary | ICD-10-CM | POA: Diagnosis not present

## 2017-08-30 DIAGNOSIS — Z6820 Body mass index (BMI) 20.0-20.9, adult: Secondary | ICD-10-CM | POA: Diagnosis not present

## 2017-08-30 DIAGNOSIS — E039 Hypothyroidism, unspecified: Secondary | ICD-10-CM | POA: Diagnosis not present

## 2017-08-30 DIAGNOSIS — E782 Mixed hyperlipidemia: Secondary | ICD-10-CM | POA: Diagnosis not present

## 2017-08-30 DIAGNOSIS — E1122 Type 2 diabetes mellitus with diabetic chronic kidney disease: Secondary | ICD-10-CM | POA: Diagnosis not present

## 2017-08-30 DIAGNOSIS — I10 Essential (primary) hypertension: Secondary | ICD-10-CM | POA: Diagnosis not present

## 2017-08-30 LAB — FECAL LACTOFERRIN, QUANT
FECAL LACTOFERRIN: POSITIVE — AB
MICRO NUMBER:: 90567229
SPECIMEN QUALITY:: ADEQUATE

## 2017-08-30 LAB — GASTROINTESTINAL PATHOGEN PANEL PCR
C. difficile Tox A/B, PCR: NOT DETECTED
CAMPYLOBACTER, PCR: NOT DETECTED
CRYPTOSPORIDIUM, PCR: NOT DETECTED
E coli (ETEC) LT/ST PCR: NOT DETECTED
E coli (STEC) stx1/stx2, PCR: NOT DETECTED
E coli 0157, PCR: NOT DETECTED
GIARDIA LAMBLIA, PCR: NOT DETECTED
NOROVIRUS, PCR: NOT DETECTED
ROTAVIRUS, PCR: NOT DETECTED
SHIGELLA, PCR: NOT DETECTED
Salmonella, PCR: NOT DETECTED

## 2017-08-30 LAB — PANCREATIC ELASTASE, FECAL

## 2017-09-01 DIAGNOSIS — J40 Bronchitis, not specified as acute or chronic: Secondary | ICD-10-CM | POA: Diagnosis not present

## 2017-09-04 ENCOUNTER — Telehealth: Payer: Self-pay

## 2017-09-04 NOTE — Telephone Encounter (Signed)
-----   Message from Alfredia Ferguson, PA-C sent at 09/04/2017  1:35 PM EDT ----- Please let pt know the stool studies are all negative or normal except for lactoferrin which is positive and can indicate inflammation of colon  We did get her records from previous Colonoscopy which was done in 2007 by dr Arnoldo Morale , no Biopsies were done.  I think she needs to have another Colonoscopy with random biopsies- schedule with Dr Loletha Carrow if pt agreeable . I think she may have microscopic colitis  Causing her diarrhea - need to have definite dx  Before putting her on medicine for this

## 2017-09-04 NOTE — Telephone Encounter (Signed)
I gave her the lab results and scheduled her colonoscopy for 09/15/17 at 9:00 am and her pre-visit on 09/08/17 at 2:00 pm.

## 2017-09-07 ENCOUNTER — Ambulatory Visit (INDEPENDENT_AMBULATORY_CARE_PROVIDER_SITE_OTHER): Payer: Medicare Other | Admitting: Otolaryngology

## 2017-09-07 DIAGNOSIS — H6123 Impacted cerumen, bilateral: Secondary | ICD-10-CM

## 2017-09-08 ENCOUNTER — Ambulatory Visit (AMBULATORY_SURGERY_CENTER): Payer: Self-pay

## 2017-09-08 VITALS — Ht 60.0 in | Wt 111.6 lb

## 2017-09-08 DIAGNOSIS — R197 Diarrhea, unspecified: Secondary | ICD-10-CM

## 2017-09-08 MED ORDER — NA SULFATE-K SULFATE-MG SULF 17.5-3.13-1.6 GM/177ML PO SOLN: 1.0000 | Freq: Once | ORAL | 0 refills | Status: AC

## 2017-09-08 NOTE — Progress Notes (Signed)
Per pt, no allergies to soy or egg products.Pt not taking any weight loss meds or using  O2 at home.  Pt refused emmi video.  During PV, pt started crying and refused to drink a split prep. Pt states she will not make it to her appt without having diarrhea, or loose stools. Talked to pt over an hour and the family member trying to calm the pt down and give her advice on the prep.  I answered a lot questions and changed the prep to Suprep, due to the volume and pt being very upset. The pt and husband will call if she has further questions. Gwyndolyn Saxon

## 2017-09-14 ENCOUNTER — Telehealth: Payer: Self-pay | Admitting: *Deleted

## 2017-09-14 NOTE — Telephone Encounter (Signed)
Spoke with patient. Answered all her questions about the Suprep. Explained to read over freq. Asked  Questions and that we have a 24 hours # listed to call us tonight if needed. Pt states okay.

## 2017-09-14 NOTE — Telephone Encounter (Signed)
June asked me to call pt because she is asking if Suprep has sulfa in it. Called pt, no answer, unable to leave voice mail, because it is "not set up". Will try later.

## 2017-09-15 ENCOUNTER — Other Ambulatory Visit: Payer: Self-pay

## 2017-09-15 ENCOUNTER — Encounter: Payer: Self-pay | Admitting: Gastroenterology

## 2017-09-15 ENCOUNTER — Ambulatory Visit (AMBULATORY_SURGERY_CENTER): Payer: Medicare Other | Admitting: Gastroenterology

## 2017-09-15 ENCOUNTER — Telehealth: Payer: Self-pay | Admitting: Gastroenterology

## 2017-09-15 VITALS — BP 105/78 | HR 71 | Temp 96.8°F | Resp 16 | Ht 60.0 in | Wt 111.0 lb

## 2017-09-15 DIAGNOSIS — K52832 Lymphocytic colitis: Secondary | ICD-10-CM | POA: Diagnosis not present

## 2017-09-15 DIAGNOSIS — R197 Diarrhea, unspecified: Secondary | ICD-10-CM

## 2017-09-15 DIAGNOSIS — Z1211 Encounter for screening for malignant neoplasm of colon: Secondary | ICD-10-CM | POA: Diagnosis not present

## 2017-09-15 MED ORDER — SODIUM CHLORIDE 0.9 % IV SOLN: 500.0000 mL | Freq: Once | INTRAVENOUS | Status: DC

## 2017-09-15 NOTE — Progress Notes (Signed)
Called to room to assist during endoscopic procedure.  Patient ID and intended procedure confirmed with present staff. Received instructions for my participation in the procedure from the performing physician.  

## 2017-09-15 NOTE — Telephone Encounter (Signed)
Left message on her spouse's cell phone.  Her number would not take messages. Advised him that she is to continue taking medication if helpful per Dr. Loletha Carrow.  I advised her to keep taking it at least until she has app. In June.  Reevaluate then if necessary.

## 2017-09-15 NOTE — Progress Notes (Signed)
To PACU, VSS. Report to RN.tb 

## 2017-09-15 NOTE — Op Note (Signed)
[facility] Patient Name: Ellen Cruz Procedure Date: 09/15/2017 8:56 AM MRN: [id number] Endoscopist: Mallie Mussel L. Loletha Carrow , MD Age: 77 Referring MD:  Date of Birth: 1940/08/06 Gender: Female Account #: [id number] Procedure:                Colonoscopy Indications:              Chronic diarrhea Medicines:                Monitored Anesthesia Care Procedure:                Pre-Anesthesia Assessment:                           - Prior to the procedure, a History and Physical                            was performed, and patient medications and                            allergies were reviewed. The patient's tolerance of                            previous anesthesia was also reviewed. The risks                            and benefits of the procedure and the sedation                            options and risks were discussed with the patient.                            All questions were answered, and informed consent                            was obtained. Prior Anticoagulants: The patient has                            taken no previous anticoagulant or antiplatelet                            agents. ASA Grade Assessment: II - A patient with                            mild systemic disease. After reviewing the risks                            and benefits, the patient was deemed in                            satisfactory condition to undergo the procedure.                           After obtaining informed consent, the colonoscope  was passed under direct vision. Throughout the                            procedure, the patient's blood pressure, pulse, and                            oxygen saturations were monitored continuously. The                            Colonoscope was introduced through the anus and                            advanced to the the terminal ileum, with                            identification of the appendiceal orifice and IC                            valve. The colonoscopy was performed without                            difficulty. The patient tolerated the procedure                            well. The quality of the bowel preparation was                            evaluated using the BBPS [facility] Bowel Preparation                            Scale) with scores of: Right Colon = 3, Transverse                            Colon = 3 and Left Colon = 3 (entire mucosa seen                            well with no residual staining, small fragments of                            stool or opaque liquid). The total BBPS score                            equals 9. The bowel preparation used was SUPREP. Scope In: 9:16:52 AM Scope Out: 9:34:37 AM Scope Withdrawal Time: 0 hours 14 minutes 49 seconds  Total Procedure Duration: 0 hours 17 minutes 45 seconds  Findings:                 The terminal ileum appeared normal.                           Diverticula were found in the left colon.                           Normal  mucosa was found in the entire colon.                            Biopsies for histology were taken with a cold                            forceps from the right colon and left colon for                            evaluation of microscopic colitis.                           The exam was otherwise without abnormality on                            direct and retroflexion views. Complications:            No immediate complications. Estimated Blood Loss:     Estimated blood loss was minimal. Impression:               - The examined portion of the ileum was normal.                           - Diverticulosis in the left colon.                           - Normal mucosa in the entire examined colon.                            Biopsied.                           - The examination was otherwise normal on direct                            and retroflexion views. Recommendation:           - Patient has a contact number  available for                            emergencies. The signs and symptoms of potential                            delayed complications were discussed with the                            patient. Return to normal activities tomorrow.                            Written discharge instructions were provided to the                            patient.                           - Resume previous diet.                           -  Continue present medications.                           - Await pathology results. Office follow up to be                            schedule afterwards.                           - No repeat routine (screening) colonoscopy due to                            age. Henry L. Loletha Carrow, MD 09/15/2017 9:38:46 AM This report has been signed electronically.

## 2017-09-15 NOTE — Patient Instructions (Signed)
YOU HAD AN ENDOSCOPIC PROCEDURE TODAY AT [facility]:   Refer to the procedure report that was given to you for any specific questions about what was found during the examination.  If the procedure report does not answer your questions, please call your gastroenterologist to clarify.  If you requested that your care partner not be given the details of your procedure findings, then the procedure report has been included in a sealed envelope for you to review at your convenience later.  YOU SHOULD EXPECT: Some feelings of bloating in the abdomen. Passage of more gas than usual.  Walking can help get rid of the air that was put into your GI tract during the procedure and reduce the bloating. If you had a lower endoscopy (such as a colonoscopy or flexible sigmoidoscopy) you may notice spotting of blood in your stool or on the toilet paper. If you underwent a bowel prep for your procedure, you may not have a normal bowel movement for a few days.  Please Note:  You might notice some irritation and congestion in your nose or some drainage.  This is from the oxygen used during your procedure.  There is no need for concern and it should clear up in a day or so.  SYMPTOMS TO REPORT IMMEDIATELY:   Following lower endoscopy (colonoscopy or flexible sigmoidoscopy):  Excessive amounts of blood in the stool  Significant tenderness or worsening of abdominal pains  Swelling of the abdomen that is new, acute  Fever of 100F or higher   For urgent or emergent issues, a gastroenterologist can be reached at any hour by calling [phone].   DIET:  We do recommend a small meal at first, but then you may proceed to your regular diet.  Drink plenty of fluids but you should avoid alcoholic beverages for 24 hours.  ACTIVITY:  You should plan to take it easy for the rest of today and you should NOT DRIVE or use heavy machinery until tomorrow (because of the sedation medicines used during the test).     FOLLOW UP: Our staff will call the number listed on your records the next business day following your procedure to check on you and address any questions or concerns that you may have regarding the information given to you following your procedure. If we do not reach you, we will leave a message.  However, if you are feeling well and you are not experiencing any problems, there is no need to return our call.  We will assume that you have returned to your regular daily activities without incident.  If any biopsies were taken you will be contacted by phone or by letter within the next 1-3 weeks.  Please call us at [phone] if you have not heard about the biopsies in 3 weeks.    SIGNATURES/CONFIDENTIALITY: You and/or your care partner have signed paperwork which will be entered into your electronic medical record.  These signatures attest to the fact that that the information above on your After Visit Summary has been reviewed and is understood.  Full responsibility of the confidentiality of this discharge information lies with you and/or your care-partner.  Diverticulosis information given.

## 2017-09-15 NOTE — Progress Notes (Signed)
[facility] Assistant helped pt. To bathroom.  She states that pt. Used her own cream on her anal area and thighs while in bathroom.

## 2017-09-15 NOTE — Progress Notes (Signed)
I have reviewed the patient's medical history in detail and updated the computerized patient record.

## 2017-09-15 NOTE — Progress Notes (Signed)
Pt. Refuses to pass gas.  States "that yellow liquid is acid and it burns like fire".. Rectal care applied to anal area prior to discharge from procedure room.

## 2017-09-18 ENCOUNTER — Telehealth: Payer: Self-pay

## 2017-09-18 NOTE — Telephone Encounter (Signed)
  Follow up Call-  Call back number 09/15/2017  Post procedure Call Back phone  # [phone]  Permission to leave phone message Yes  Some recent data might be hidden     Patient questions:  Do you have a fever, pain , or abdominal swelling? No. Pain Score  0 *  Have you tolerated food without any problems? Yes.    Have you been able to return to your normal activities? Yes.    Do you have any questions about your discharge instructions: Diet   No. Medications  No. Follow up visit  No.  Do you have questions or concerns about your Care? No.  Actions: * If pain score is 4 or above: No action needed, pain <4.

## 2017-09-21 DIAGNOSIS — B351 Tinea unguium: Secondary | ICD-10-CM | POA: Diagnosis not present

## 2017-09-21 DIAGNOSIS — M79672 Pain in left foot: Secondary | ICD-10-CM | POA: Diagnosis not present

## 2017-09-21 DIAGNOSIS — M79671 Pain in right foot: Secondary | ICD-10-CM | POA: Diagnosis not present

## 2017-09-21 DIAGNOSIS — E1140 Type 2 diabetes mellitus with diabetic neuropathy, unspecified: Secondary | ICD-10-CM | POA: Diagnosis not present

## 2017-09-22 ENCOUNTER — Telehealth: Payer: Self-pay | Admitting: Gastroenterology

## 2017-09-22 ENCOUNTER — Other Ambulatory Visit: Payer: Self-pay

## 2017-09-22 DIAGNOSIS — K52838 Other microscopic colitis: Secondary | ICD-10-CM

## 2017-09-22 MED ORDER — BUDESONIDE ER 9 MG PO TB24: 9.0000 mg | ORAL_TABLET | Freq: Every day | ORAL | 2 refills | Status: DC

## 2017-09-22 NOTE — Telephone Encounter (Signed)
Pt called regarding medication that she was prescribed for colitis. She does not remember name of med but mentioned that her pharmacy told her today that they faxed over order as it needs PA so her insurance can cover it. Pls call her.

## 2017-09-22 NOTE — Telephone Encounter (Signed)
PA for Uceris has been sent via Cover my meds. Pt has been notified and aware. We will contact her after the PA.

## 2017-09-25 DIAGNOSIS — E538 Deficiency of other specified B group vitamins: Secondary | ICD-10-CM | POA: Diagnosis not present

## 2017-09-25 DIAGNOSIS — Z6820 Body mass index (BMI) 20.0-20.9, adult: Secondary | ICD-10-CM | POA: Diagnosis not present

## 2017-09-25 DIAGNOSIS — K523 Indeterminate colitis: Secondary | ICD-10-CM | POA: Diagnosis not present

## 2017-09-26 ENCOUNTER — Other Ambulatory Visit: Payer: Self-pay

## 2017-09-26 MED ORDER — BUDESONIDE 3 MG PO CPEP: 9.0000 mg | ORAL_CAPSULE | Freq: Every day | ORAL | 0 refills | Status: DC

## 2017-09-26 NOTE — Telephone Encounter (Signed)
Yes, let's just go with the pepto for now to keep it simple.

## 2017-09-26 NOTE — Telephone Encounter (Signed)
New Rx for Uceris 3 mg caps, 3 po daily has been sent in as advised by Dr Loletha Carrow

## 2017-09-26 NOTE — Telephone Encounter (Signed)
Please see the result note attached to pathology report for my plan for alternate medicine if Uceris not approved (entocort)

## 2017-09-26 NOTE — Telephone Encounter (Signed)
Pt notified to use 2 po bid

## 2017-09-26 NOTE — Telephone Encounter (Signed)
Not quite sure what patient is wanting. Pt states [facility] mail [facility] does not carry uceris and does not cover budesonide. Pt states she is no longer using [facility] and needs something called in before she dies.

## 2017-09-26 NOTE — Telephone Encounter (Signed)
So I tried to send in the Asacol, its flagged as she has an allergy to Aspirin. Should she just take the Pepto and forget the Asacol? Pt states the allergy, isn't really an allergy, she was told not to take it due to her kidneys.

## 2017-09-26 NOTE — Telephone Encounter (Signed)
Pt is asking if she needs to take two tablets of Pepto bismol at the same time. Best call back # [phone]

## 2017-09-26 NOTE — Telephone Encounter (Signed)
I will do my best within the limits of what her insurance will cover for medicines.  Asacol HD 800 mg.  Sig:  2 tablets twice daily.  Disp #120, RF 2  If denied, then I need to know from [facility] what medicine they will cover for lymphocytic (microscopic colitis) , since I cannot continue to guess at it.  Some people have also used pepto bismol 2 tablets twice daily, available over the counter.

## 2017-09-26 NOTE — Telephone Encounter (Signed)
Please advise, the Uceris 3 mg 3 a day is not covered. What would you like to try next?

## 2017-09-26 NOTE — Telephone Encounter (Signed)
PA has been denied. Please advise.

## 2017-09-27 NOTE — Telephone Encounter (Signed)
Pt has been informed and will start the Pepto as directed.

## 2017-09-28 ENCOUNTER — Encounter: Payer: Self-pay | Admitting: Physician Assistant

## 2017-09-28 ENCOUNTER — Ambulatory Visit: Payer: Medicare Other | Admitting: Physician Assistant

## 2017-09-28 ENCOUNTER — Encounter (INDEPENDENT_AMBULATORY_CARE_PROVIDER_SITE_OTHER): Payer: Self-pay

## 2017-09-28 ENCOUNTER — Ambulatory Visit (INDEPENDENT_AMBULATORY_CARE_PROVIDER_SITE_OTHER): Payer: Medicare Other | Admitting: Physician Assistant

## 2017-09-28 VITALS — BP 130/70 | HR 72 | Ht 60.0 in | Wt 111.0 lb

## 2017-09-28 DIAGNOSIS — K52832 Lymphocytic colitis: Secondary | ICD-10-CM | POA: Diagnosis not present

## 2017-09-28 DIAGNOSIS — J301 Allergic rhinitis due to pollen: Secondary | ICD-10-CM | POA: Diagnosis not present

## 2017-09-28 DIAGNOSIS — Z6820 Body mass index (BMI) 20.0-20.9, adult: Secondary | ICD-10-CM | POA: Diagnosis not present

## 2017-09-28 MED ORDER — AMBULATORY NON FORMULARY MEDICATION: 6 refills | Status: DC

## 2017-09-28 NOTE — Progress Notes (Signed)
Subjective:    Patient ID: Ellen Cruz, female    DOB: 01-Dec-1940, 77 y.o.   MRN: [id number]  HPI Ellen Cruz is a pleasant 77 year old white female, recently established with Dr. Loletha Carrow and seen by myself on 08/23/2017 with complaints of chronic diarrhea.  She has since undergone colonoscopy on 09/15/2017 which showed normal mucosa, no polyps.  She did have diverticulosis.  Random biopsies were taken and these are consistent with microscopic colitis.  Patient was to be started on budesonide 9 mg p.o. daily.  Unfortunately thus far have been unable to get this authorized for her.  Attempts were made to get her started on Azo call however she apparently has been intolerant to aspirin in the past.  She was then asked to take Pepto-Bismol 2 p.o. twice daily. She comes back in today for follow-up.  She feels that the Pepto-Bismol actually makes her diarrhea somewhat worse. Ultimately she is not having is better diarrhea as she was when she was initially seen and having episodes of incontinence.  She says most days she is having 2-3 bowel movements per day and most of these are formed to semi-formed.  No current complaints of abdominal pain. She had been given a prescription for Robinul forte 2 mg twice daily prior to colonoscopy.  She says this worked great for the diarrhea but unfortunately caused her to have a very dry mouth and some blurry vision.  She did better with decreasing the dose ,but still had symptoms and it was discontinued.  Review of Systems Pertinent positive and negative review of systems were noted in the above HPI section.  All other review of systems was otherwise negative.  Outpatient Encounter Medications as of 09/28/2017  Medication Sig  . Bismuth Subsalicylate 938 MG TABS Take 524 mg by mouth 2 (two) times daily.  . cholecalciferol (VITAMIN D) 1000 units tablet Take 1,000 Units by mouth daily.  . diazepam (VALIUM) 10 MG tablet Take 10 mg by mouth 3 (three) times daily as needed  for anxiety.  . diphenhydrAMINE (BENADRYL) 25 MG tablet Take 25 mg by mouth as needed for allergies.  . hydroxypropyl methylcellulose / hypromellose (ISOPTO TEARS / GONIOVISC) 2.5 % ophthalmic solution Place 1 drop into both eyes as needed for dry eyes.  Marland Kitchen levothyroxine (SYNTHROID, LEVOTHROID) 75 MCG tablet Take 75 mcg by mouth daily.  . AMBULATORY NON FORMULARY MEDICATION Medication Name: Budesonide 5 mg take 2 tablets by mouth daily   No facility-administered encounter medications on file as of 09/28/2017.    Allergies  Allergen Reactions  . Glycopyrrolate Other (See Comments)    "cotton mouth" and blurred vision  . Asa [Aspirin] Other (See Comments)    unknown  . Codeine Nausea Only  . Lactose Intolerance (Gi)     Causes diarrhea  . Lexapro [Escitalopram Oxalate] Palpitations  . Phenothiazines Rash  . Statins Other (See Comments)    Muscle aches  . Sulfa Antibiotics Nausea Only  . Zyrtec [Cetirizine] Rash   There are no active problems to display for this patient.  Social History   Socioeconomic History  . Marital status: Married    Spouse name: Not on file  . Number of children: 2  . Years of education: Not on file  . Highest education level: Not on file  Occupational History  . Occupation: retired  Scientific laboratory technician  . Financial resource strain: Not on file  . Food insecurity:    Worry: Not on file    Inability: Not on  file  . Transportation needs:    Medical: Not on file    Non-medical: Not on file  Tobacco Use  . Smoking status: Never Smoker  . Smokeless tobacco: Never Used  Substance and Sexual Activity  . Alcohol use: Never    Frequency: Never  . Drug use: Never  . Sexual activity: Not on file  Lifestyle  . Physical activity:    Days per week: Not on file    Minutes per session: Not on file  . Stress: Not on file  Relationships  . Social connections:    Talks on phone: Not on file    Gets together: Not on file    Attends religious service: Not on file      Active member of club or organization: Not on file    Attends meetings of clubs or organizations: Not on file    Relationship status: Not on file  . Intimate partner violence:    Fear of current or ex partner: Not on file    Emotionally abused: Not on file    Physically abused: Not on file    Forced sexual activity: Not on file  Other Topics Concern  . Not on file  Social History Narrative  . Not on file    Ellen Cruz's family history includes Colon cancer in her father.      Objective:    Vitals:   09/28/17 1029  BP: 130/70  Pulse: 72    Physical Exam; well-developed elderly white female in no acute distress, very pleasant blood pressure 130/70 pulse 72, height 5 foot, weight 111, BMI 21.6.  HEENT nontraumatic normocephalic EOMI PERRLA sclera anicteric, Cardiovascular regular rate and rhythm with S1-S2 no murmur rub gallop, Pulmonary clear bilaterally, Abdomen soft, nontender nondistended bowel sounds are active there is no palpable mass or hepatosplenomegaly, Rectal exam not done, Extremities no clubbing cyanosis or edema skin warm and dry, Neuro psych ;alert and oriented, grossly nonfocal mood and affect appropriate       Assessment & Plan:   #93 77 year old white female with chronic diarrhea and new diagnosis of microscopic colitis. Diarrhea has improved over the past few weeks without any specific therapy, and is now having 2-3 semi-formed stools per day.  Obtaining budesonide at a reasonable cost has been an issue, and she has not been treated as yet. Patient is intolerant to aspirin. No benefit from Pepto-Bismol   Plan; Have sent a new prescription to [facility] in [place] which does compounding for budesonide in 5 mg tablets.  She is instructed to take 2 tablets daily.  This medication can be obtained  For $100 per month which  she says she can manage. Will plan office follow-up with myself in August.  She knows to call in the interim for any  problems.  Lacresha Fusilier S Leotis Isham PA-C 09/28/2017   Cc: Practice, [facility]*

## 2017-09-28 NOTE — Patient Instructions (Addendum)
We have sent the following medication to [facility] in [facility]:  Budesonide 5 mg to take 2 tablets by mouth daily.   There address is [address], [place]  [address]. Please give them a call if you have any questions [phone].

## 2017-09-29 NOTE — Progress Notes (Signed)
Thank you for sending this case to me. I have reviewed the entire note, and the outlined plan seems appropriate.  While she does have microscopic colitis, she also has a significant element of anxiety-driven IBS.  Wilfrid Lund, MD

## 2017-09-30 DIAGNOSIS — J0101 Acute recurrent maxillary sinusitis: Secondary | ICD-10-CM | POA: Diagnosis not present

## 2017-09-30 DIAGNOSIS — J301 Allergic rhinitis due to pollen: Secondary | ICD-10-CM | POA: Diagnosis not present

## 2017-09-30 DIAGNOSIS — Z6820 Body mass index (BMI) 20.0-20.9, adult: Secondary | ICD-10-CM | POA: Diagnosis not present

## 2017-10-09 ENCOUNTER — Telehealth: Payer: Self-pay | Admitting: Physician Assistant

## 2017-10-09 NOTE — Telephone Encounter (Signed)
Left message returning her call

## 2017-10-11 NOTE — Telephone Encounter (Signed)
Patient asks her spouse be taken off as an emergency contact.

## 2017-10-13 DIAGNOSIS — K580 Irritable bowel syndrome with diarrhea: Secondary | ICD-10-CM | POA: Diagnosis not present

## 2017-10-13 DIAGNOSIS — L570 Actinic keratosis: Secondary | ICD-10-CM | POA: Diagnosis not present

## 2017-10-13 DIAGNOSIS — Z6820 Body mass index (BMI) 20.0-20.9, adult: Secondary | ICD-10-CM | POA: Diagnosis not present

## 2017-10-25 ENCOUNTER — Telehealth: Payer: Self-pay | Admitting: Physician Assistant

## 2017-10-26 NOTE — Telephone Encounter (Signed)
Patient reports multiple diarrhea bowel movements yesterday. No bowel movements today. To continue the Budesonide and stay hydrated.

## 2017-10-27 ENCOUNTER — Telehealth: Payer: Self-pay | Admitting: Physician Assistant

## 2017-10-27 NOTE — Telephone Encounter (Signed)
Pt needs some advise with constipation. She will be going to a MD appt in 30 minutes so is requesting a call back asap, pls.

## 2017-10-27 NOTE — Telephone Encounter (Signed)
Voicemail answers and cuts off. Yesterday the patient did state she was having phone problems.

## 2017-10-31 NOTE — Telephone Encounter (Signed)
Left message to call back if she is continuing to have GI concerns she would like to discuss.

## 2017-11-02 ENCOUNTER — Ambulatory Visit (INDEPENDENT_AMBULATORY_CARE_PROVIDER_SITE_OTHER): Payer: Medicare Other | Admitting: Otolaryngology

## 2017-11-02 DIAGNOSIS — H6123 Impacted cerumen, bilateral: Secondary | ICD-10-CM

## 2017-11-13 ENCOUNTER — Ambulatory Visit (INDEPENDENT_AMBULATORY_CARE_PROVIDER_SITE_OTHER): Payer: Medicare Other | Admitting: Physician Assistant

## 2017-11-13 ENCOUNTER — Encounter: Payer: Self-pay | Admitting: Physician Assistant

## 2017-11-13 VITALS — BP 120/78 | HR 80 | Ht 61.0 in | Wt 107.0 lb

## 2017-11-13 DIAGNOSIS — K582 Mixed irritable bowel syndrome: Secondary | ICD-10-CM

## 2017-11-13 DIAGNOSIS — K52832 Lymphocytic colitis: Secondary | ICD-10-CM | POA: Diagnosis not present

## 2017-11-13 NOTE — Patient Instructions (Addendum)
Continue Budesonide 5 mg daily( take 1 pill daily).   Call back in August and make a follow up appointment with Nicoletta Ba PA for mid September.    If you are age 78 or older, your body mass index should be between 23-30. Your Body mass index is 20.22 kg/m. If this is out of the aforementioned range listed, please consider follow up with your Primary Care Provider.
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
## 2017-11-14 NOTE — Progress Notes (Signed)
Thank you for sending this case to me. I have reviewed the entire note, and the outlined plan seems appropriate.  While she has microscopic colitis, there is a significant element of anxiety contributing to her symptoms.  Wilfrid Lund, MD

## 2017-11-15 ENCOUNTER — Telehealth: Payer: Self-pay | Admitting: Physician Assistant

## 2017-11-15 DIAGNOSIS — D519 Vitamin B12 deficiency anemia, unspecified: Secondary | ICD-10-CM | POA: Diagnosis not present

## 2017-11-15 NOTE — Telephone Encounter (Signed)
Patient calls to tell me that she stopped the budesonide and she had terrible diarrhea yesterday going "over 16 times" and she "could not even drink water without the diarrhea running down my leg." Reviewed the last office visit and her AVS. Advised the patient she was not supposed to stop the budesonide but rather decrease it to 5 mg (one capsule) every day.  She then says "No no, I didn't stop it. But today I took 2 capsules." Reviewed again with her that she is to decrease the dosage to Budesonide 5 mg once daily every day. A follow appointment is scheduled with her also.

## 2017-11-16 ENCOUNTER — Ambulatory Visit: Payer: PRIVATE HEALTH INSURANCE | Admitting: Gastroenterology

## 2017-11-30 DIAGNOSIS — D519 Vitamin B12 deficiency anemia, unspecified: Secondary | ICD-10-CM | POA: Diagnosis not present

## 2017-11-30 DIAGNOSIS — E782 Mixed hyperlipidemia: Secondary | ICD-10-CM | POA: Diagnosis not present

## 2017-11-30 DIAGNOSIS — I10 Essential (primary) hypertension: Secondary | ICD-10-CM | POA: Diagnosis not present

## 2017-11-30 DIAGNOSIS — E1142 Type 2 diabetes mellitus with diabetic polyneuropathy: Secondary | ICD-10-CM | POA: Diagnosis not present

## 2017-11-30 DIAGNOSIS — E1122 Type 2 diabetes mellitus with diabetic chronic kidney disease: Secondary | ICD-10-CM | POA: Diagnosis not present

## 2017-11-30 DIAGNOSIS — N183 Chronic kidney disease, stage 3 (moderate): Secondary | ICD-10-CM | POA: Diagnosis not present

## 2017-11-30 DIAGNOSIS — E1165 Type 2 diabetes mellitus with hyperglycemia: Secondary | ICD-10-CM | POA: Diagnosis not present

## 2017-11-30 DIAGNOSIS — Z9189 Other specified personal risk factors, not elsewhere classified: Secondary | ICD-10-CM | POA: Diagnosis not present

## 2017-11-30 DIAGNOSIS — E039 Hypothyroidism, unspecified: Secondary | ICD-10-CM | POA: Diagnosis not present

## 2017-11-30 DIAGNOSIS — K210 Gastro-esophageal reflux disease with esophagitis: Secondary | ICD-10-CM | POA: Diagnosis not present

## 2017-12-04 DIAGNOSIS — I10 Essential (primary) hypertension: Secondary | ICD-10-CM | POA: Diagnosis not present

## 2017-12-04 DIAGNOSIS — Z23 Encounter for immunization: Secondary | ICD-10-CM | POA: Diagnosis not present

## 2017-12-04 DIAGNOSIS — Z6820 Body mass index (BMI) 20.0-20.9, adult: Secondary | ICD-10-CM | POA: Diagnosis not present

## 2017-12-04 DIAGNOSIS — E039 Hypothyroidism, unspecified: Secondary | ICD-10-CM | POA: Diagnosis not present

## 2017-12-04 DIAGNOSIS — E782 Mixed hyperlipidemia: Secondary | ICD-10-CM | POA: Diagnosis not present

## 2017-12-04 DIAGNOSIS — E1122 Type 2 diabetes mellitus with diabetic chronic kidney disease: Secondary | ICD-10-CM | POA: Diagnosis not present

## 2017-12-04 DIAGNOSIS — K219 Gastro-esophageal reflux disease without esophagitis: Secondary | ICD-10-CM | POA: Diagnosis not present

## 2017-12-04 DIAGNOSIS — F411 Generalized anxiety disorder: Secondary | ICD-10-CM | POA: Diagnosis not present

## 2017-12-07 DIAGNOSIS — D519 Vitamin B12 deficiency anemia, unspecified: Secondary | ICD-10-CM | POA: Diagnosis not present

## 2017-12-08 ENCOUNTER — Ambulatory Visit (INDEPENDENT_AMBULATORY_CARE_PROVIDER_SITE_OTHER): Payer: Medicare Other | Admitting: Physician Assistant

## 2017-12-08 ENCOUNTER — Encounter: Payer: Self-pay | Admitting: Physician Assistant

## 2017-12-08 ENCOUNTER — Other Ambulatory Visit (INDEPENDENT_AMBULATORY_CARE_PROVIDER_SITE_OTHER): Payer: Medicare Other

## 2017-12-08 VITALS — BP 130/70 | HR 81 | Ht 61.0 in | Wt 107.0 lb

## 2017-12-08 DIAGNOSIS — K582 Mixed irritable bowel syndrome: Secondary | ICD-10-CM

## 2017-12-08 DIAGNOSIS — K52832 Lymphocytic colitis: Secondary | ICD-10-CM

## 2017-12-08 DIAGNOSIS — K52838 Other microscopic colitis: Secondary | ICD-10-CM | POA: Diagnosis not present

## 2017-12-08 LAB — TSH: TSH: 0.44 u[IU]/mL (ref 0.35–4.50)

## 2017-12-08 NOTE — Progress Notes (Signed)
Subjective:    Patient ID: Ellen Cruz, female    DOB: [birth year], 77 y.o.   MRN: [id number]  HPI Bronte is a pleasant 77 year old white female, known to Dr. Loletha Carrow  and myself with history of IBS, lymphocytic colitis, diverticulosis, hypothyroidism, and chronic anxiety. He was last seen in the office on 11/13/2017 after being started on budesonide 10 mg daily for lymphocytic colitis.  At that time she was still having periodic episodes of "horrible diarrhea with multiple bowel movements per day followed by episodes of formed stools.  Her episodes definitely seem to be exacerbated by stress and anxiety.   She been somewhat inconsistently with taking the budesonide daily. At that time, budesonide was decreased to 5 mg p.o. daily. He had called the office a couple of weeks ago after stopping the budesonide again had developed severe diarrhea with 16 bowel movements in 1 day, and incontinence.  She was told to restart the budesonide once daily 5 mg and was made a follow-up appointment. She says she is been taking the medication regularly and when she does states that she will have a fairly regular bowel movement 1 day and will go at all for a day or 2, develops a discomfort straining and hard stool.  However she also relates an episode of having 6 diarrheal stools earlier today. She never sees any blood.  She does not complain of pain or cramping.  Her weight is very stable.  She complains of fatigue which has been ongoing.  She had recent visit with her PCP, and has been started on B12 replacement.  Review of Systems Pertinent positive and negative review of systems were noted in the above HPI section.  All other review of systems was otherwise negative.  Outpatient Encounter Medications as of 12/08/2017  Medication Sig  . AMBULATORY NON FORMULARY MEDICATION Medication Name: Budesonide 5 mg take 2 tablets by mouth daily  . diazepam (VALIUM) 10 MG tablet Take 10 mg by mouth 3 (three) times daily  as needed for anxiety.  . diphenhydrAMINE (BENADRYL) 25 MG tablet Take 25 mg by mouth as needed for allergies.  . hydroxypropyl methylcellulose / hypromellose (ISOPTO TEARS / GONIOVISC) 2.5 % ophthalmic solution Place 1 drop into both eyes as needed for dry eyes.  Marland Kitchen levothyroxine (SYNTHROID, LEVOTHROID) 75 MCG tablet Take 75 mcg by mouth daily.  . [DISCONTINUED] Bismuth Subsalicylate 400 MG TABS Take 524 mg by mouth 2 (two) times daily.   No facility-administered encounter medications on file as of 12/08/2017.    Allergies  Allergen Reactions  . Glycopyrrolate Other (See Comments)    "cotton mouth" and blurred vision  . Asa [Aspirin] Other (See Comments)    unknown  . Codeine Nausea Only  . Lactose Intolerance (Gi)     Causes diarrhea  . Lexapro [Escitalopram Oxalate] Palpitations  . Phenothiazines Rash  . Statins Other (See Comments)    Muscle aches  . Sulfa Antibiotics Nausea Only  . Zyrtec [Cetirizine] Rash   There are no active problems to display for this patient.  Social History   Socioeconomic History  . Marital status: Married    Spouse name: Not on file  . Number of children: 2  . Years of education: Not on file  . Highest education level: Not on file  Occupational History  . Occupation: retired  Scientific laboratory technician  . Financial resource strain: Not on file  . Food insecurity:    Worry: Not on file    Inability: Not  on file  . Transportation needs:    Medical: Not on file    Non-medical: Not on file  Tobacco Use  . Smoking status: Never Smoker  . Smokeless tobacco: Never Used  Substance and Sexual Activity  . Alcohol use: Never    Frequency: Never  . Drug use: Never  . Sexual activity: Not on file  Lifestyle  . Physical activity:    Days per week: Not on file    Minutes per session: Not on file  . Stress: Not on file  Relationships  . Social connections:    Talks on phone: Not on file    Gets together: Not on file    Attends religious service: Not on file     Active member of club or organization: Not on file    Attends meetings of clubs or organizations: Not on file    Relationship status: Not on file  . Intimate partner violence:    Fear of current or ex partner: Not on file    Emotionally abused: Not on file    Physically abused: Not on file    Forced sexual activity: Not on file  Other Topics Concern  . Not on file  Social History Narrative  . Not on file    Ms. Avina's family history includes Colon cancer in her father.      Objective:    Vitals:   12/08/17 1449  BP: 130/70  Pulse: 81    Physical Exam; well-developed elderly white female in no acute distress, pleasant accompanied by her husband today blood pressure 130/70 pulse 81, height 5 foot 1, weight 107, BMI 20.2.  HEENT; nontraumatic normocephalic EOMI PERRLA sclera anicteric oral mucosa moist, Cardiovascular; regular rate and rhythm with S1-S2 no murmur rub gallop, Pulmonary; clear bilaterally, Abdomen; soft, nontender, nondistended bowel sounds are active no palpable mass or hepatosplenomegaly, Rectal; exam not done, Extremities; no clubbing cyanosis or edema skin warm and dry, Neuro psych ;alert and oriented, grossly nonfocal mood and affect appropriate she is anxious as usual       Assessment & Plan:   #47 77 year old white female with combination of IBS and lymphocytic colitis with alternating bowel habits and episodes of diarrhea which seem to be less with the addition of budesonide 5 mg daily.  #2 chronic anxiety #3.  Diverticulosis #4  Fatigue  Plan; Start  budesonide 5 mg every other day Start Benefiber 1 scoop daily in 6 to 8 ounces of water TSH  I will see in follow-up in 1 month. Rater than 50% of visit spent in counseling discussion regarding disease management.  Janelly Switalski S Rickey Farrier PA-C 12/08/2017   Cc: [facility]

## 2017-12-08 NOTE — Patient Instructions (Signed)
Your provider has requested that you go to the basement level for lab work before leaving today. Press "B" on the elevator. The lab is located at the first door on the left as you exit the elevator.  Take budesonide every other day.   Use Benefiber one dose daily in 6 ounces of water.

## 2017-12-12 LAB — TISSUE TRANSGLUTAMINASE, IGA: (tTG) Ab, IgA: 1 U/mL

## 2017-12-14 DIAGNOSIS — D519 Vitamin B12 deficiency anemia, unspecified: Secondary | ICD-10-CM | POA: Diagnosis not present

## 2017-12-19 DIAGNOSIS — Z6820 Body mass index (BMI) 20.0-20.9, adult: Secondary | ICD-10-CM | POA: Diagnosis not present

## 2017-12-19 DIAGNOSIS — R600 Localized edema: Secondary | ICD-10-CM | POA: Diagnosis not present

## 2017-12-21 DIAGNOSIS — E1140 Type 2 diabetes mellitus with diabetic neuropathy, unspecified: Secondary | ICD-10-CM | POA: Diagnosis not present

## 2017-12-21 DIAGNOSIS — B351 Tinea unguium: Secondary | ICD-10-CM | POA: Diagnosis not present

## 2017-12-21 DIAGNOSIS — M79671 Pain in right foot: Secondary | ICD-10-CM | POA: Diagnosis not present

## 2017-12-21 DIAGNOSIS — D519 Vitamin B12 deficiency anemia, unspecified: Secondary | ICD-10-CM | POA: Diagnosis not present

## 2017-12-21 DIAGNOSIS — M79672 Pain in left foot: Secondary | ICD-10-CM | POA: Diagnosis not present

## 2018-01-04 ENCOUNTER — Encounter (HOSPITAL_COMMUNITY): Payer: Self-pay

## 2018-01-04 ENCOUNTER — Ambulatory Visit (INDEPENDENT_AMBULATORY_CARE_PROVIDER_SITE_OTHER): Payer: Medicare Other | Admitting: Otolaryngology

## 2018-01-04 ENCOUNTER — Other Ambulatory Visit: Payer: Self-pay

## 2018-01-04 ENCOUNTER — Emergency Department (HOSPITAL_COMMUNITY)
Admission: EM | Admit: 2018-01-04 | Discharge: 2018-01-04 | Disposition: A | Discharge status: Home / Self Care | Payer: Medicare Other | Attending: Emergency Medicine | Admitting: Emergency Medicine

## 2018-01-04 DIAGNOSIS — Y92810 Car as the place of occurrence of the external cause: Secondary | ICD-10-CM | POA: Diagnosis not present

## 2018-01-04 DIAGNOSIS — Y998 Other external cause status: Secondary | ICD-10-CM | POA: Insufficient documentation

## 2018-01-04 DIAGNOSIS — Y9389 Activity, other specified: Secondary | ICD-10-CM | POA: Diagnosis not present

## 2018-01-04 DIAGNOSIS — E1122 Type 2 diabetes mellitus with diabetic chronic kidney disease: Secondary | ICD-10-CM | POA: Insufficient documentation

## 2018-01-04 DIAGNOSIS — S81812A Laceration without foreign body, left lower leg, initial encounter: Secondary | ICD-10-CM | POA: Diagnosis not present

## 2018-01-04 DIAGNOSIS — E079 Disorder of thyroid, unspecified: Secondary | ICD-10-CM | POA: Diagnosis not present

## 2018-01-04 DIAGNOSIS — N183 Chronic kidney disease, stage 3 (moderate): Secondary | ICD-10-CM | POA: Diagnosis not present

## 2018-01-04 DIAGNOSIS — S8992XA Unspecified injury of left lower leg, initial encounter: Secondary | ICD-10-CM | POA: Diagnosis present

## 2018-01-04 DIAGNOSIS — Z79899 Other long term (current) drug therapy: Secondary | ICD-10-CM | POA: Diagnosis not present

## 2018-01-04 DIAGNOSIS — H6123 Impacted cerumen, bilateral: Secondary | ICD-10-CM | POA: Diagnosis not present

## 2018-01-04 DIAGNOSIS — W228XXA Striking against or struck by other objects, initial encounter: Secondary | ICD-10-CM | POA: Diagnosis not present

## 2018-01-04 MED ORDER — MUPIROCIN CALCIUM 2 % EX CREA
TOPICAL_CREAM | Freq: Two times a day (BID) | CUTANEOUS | Status: DC
  Administered 2018-01-04: 16:00:00 via TOPICAL
  Filled 2018-01-04: qty 15

## 2018-01-04 NOTE — ED Provider Notes (Signed)
Medical screening examination/treatment/procedure(s) were conducted as a shared visit with non-physician practitioner(s) and myself.  I personally evaluated the patient during the encounter.  Patient sustained a skin tear we will try to get in the truck earlier today.  It is approximately 3 cm x 3 cm 90 degrees skin tear and patient states to me that she does not want any kind of repair to it as it usually just makes things worse.  She does not think she broke anything she been ambulate without difficulty.  Patient will perform wound care at home.    Merrily Pew, MD 01/04/18 2022

## 2018-01-04 NOTE — Discharge Instructions (Signed)
You are seen in the emergency department today for a skin tear.  As we discussed we did not put stitches or skin glue or Steri-Strips on this area.  The area was cleaned with application of antibiotic ointment.  Please keep this area clean and dry the best you can.  Please apply over-the-counter antibiotics per over-the-counter instructions.  We would like you to follow-up with your primary care provider within 5 days for recheck of this area.  If you start to experience any new or worsening symptoms including but not limited to fever, redness of the wound, purulent drainage, increased swelling, or any other concerns return to the emergency department.

## 2018-01-04 NOTE — ED Triage Notes (Signed)
Patient states that she hit her leg on a running board and cut it open. States that she called her family doctor and they advised her to come to the ER to be seen.

## 2018-01-04 NOTE — ED Provider Notes (Signed)
[facility] EMERGENCY DEPARTMENT Provider Note   CSN: [id number] Arrival date & time: 01/04/18  1456   History   Chief Complaint Chief Complaint  Patient presents with  . Laceration    HPI Ellen Cruz is a 77 y.o. female with a history of diabetes mellitus and CKD who presents to the emergency department s/p skin tear which occurred at 13:30 this afternoon. Patient reports she accidentally cut the left anterior lower leg on the running board of a car (rubber portion). She did not have a traumatic fall to the ground. She states she stopped the bleeding with application of pressure. No other specific alleviating/aggravating factors. Skin to this area is somewhat uncomfortable. She reports hx of several prior skin tears, attempts have been made to close these with sutures and with steri strips, all unsuccessful, she does not wish to have this done today. She has not previously required abx for a wound infection. Her last tetanus was within past 5 years. Denies numbness, weakness, or paresthesias. She called her PCP and they recommended she go to the ER.   HPI  Past Medical History:  Diagnosis Date  . At risk for falling    has cane  . Chronic kidney disease    stage 3/  per pt  . Diabetes mellitus without complication ([place])    no meds/controlled by diet  . Diarrhea   . Thyroid disease     There are no active problems to display for this patient.   Past Surgical History:  Procedure Laterality Date  . APPENDECTOMY    . CHOLECYSTECTOMY    . HEMORRHOID SURGERY     3 times  . TONSILLECTOMY    . VAGINAL HYSTERECTOMY       OB History   None      Home Medications    Prior to Admission medications   Medication Sig Start Date End Date Taking? Authorizing Provider  AMBULATORY NON FORMULARY MEDICATION Medication Name: Budesonide 5 mg take 2 tablets by mouth daily 09/28/17   Esterwood, Amy S, PA-C  diazepam (VALIUM) 10 MG tablet Take 10 mg by mouth 3 (three) times daily  as needed for anxiety. 08/15/17   [provider]  diphenhydrAMINE (BENADRYL) 25 MG tablet Take 25 mg by mouth as needed for allergies.    [provider]  hydroxypropyl methylcellulose / hypromellose (ISOPTO TEARS / GONIOVISC) 2.5 % ophthalmic solution Place 1 drop into both eyes as needed for dry eyes.    [provider]  levothyroxine (SYNTHROID, LEVOTHROID) 75 MCG tablet Take 75 mcg by mouth daily. 07/19/17   [provider]    Family History Family History  Problem Relation Age of Onset  . Colon cancer Father     Social History Social History   Tobacco Use  . Smoking status: Never Smoker  . Smokeless tobacco: Never Used  Substance Use Topics  . Alcohol use: Never    Frequency: Never  . Drug use: Never     Allergies   Glycopyrrolate; Asa [aspirin]; Codeine; Lactose intolerance (gi); Lexapro [escitalopram oxalate]; Phenothiazines; Statins; Sulfa antibiotics; and Zyrtec [cetirizine]   Review of Systems Review of Systems  Constitutional: Negative for chills and fever.  Skin: Positive for wound.  Neurological: Negative for weakness and numbness.       Negative for paresthesias.      Physical Exam Updated Vital Signs BP (!) 145/84 (BP Location: Right Arm)   Pulse 85   Temp 98 F (36.7 C) (Oral)  Resp 18   Ht 5\' 2"  (1.575 m)   Wt 48.5 kg   SpO2 99%   BMI 19.56 kg/m   Physical Exam  Constitutional: She appears well-developed and well-nourished. No distress.  HENT:  Head: Normocephalic and atraumatic.  Eyes: Conjunctivae are normal. Right eye exhibits no discharge. Left eye exhibits no discharge.  Cardiovascular:  2+ symmetric DP/PT pulses.   Musculoskeletal:  Normal ROM to bilateral knees/ankles. No bony tenderness to lower extremities, mild tenderness to skin tear area.   Neurological: She is alert.  Clear speech. Sensation grossly intact to bilateral lower extremities. 5/5 strength with plantar/dorsiflexion bilaterally.  Ambulatory.   Skin:  Patient has a 5cm curved skin tear to the distal lower left leg. Mild associated ecchymosis. This area is slightly tender to palpation. No active bleeding or appreciable FBs. NVI distally.   Psychiatric: She has a normal mood and affect. Her behavior is normal. Thought content normal.  Nursing note and vitals reviewed.      ED Treatments / Results  Labs (all labs ordered are listed, but only abnormal results are displayed) Labs Reviewed - No data to display  EKG None  Radiology No results found.  Procedures Procedures (including critical care time)  Medications Ordered in ED Medications  mupirocin cream (BACTROBAN) 2 % ( Topical Given 01/04/18 1555)     Initial Impression / Assessment and Plan / ED Course  I have reviewed the triage vital signs and the nursing notes.  Pertinent labs & imaging results that were available during my care of the patient were reviewed by me and considered in my medical decision making (see chart for details).   Patient presents to the ED with skin tear to left lower anterior leg, this does not appear to be amenable to repair with sutures/steristrips/staples/skin adhesive due to patient's thin skin as well as her reported prior skin tear repair failures. The area was irrigated with sterile water, abx ointment with nonstick dressing and coband applied. Tetanus is up to date. Do not feel that abx prophylaxis is necessary given fairly clean appearing wound and patient without poor wound healing issues previously. I discussed wound care, need for PCP follow-up, and return precautions with the patient and her husband at bedside. Provided opportunity for questions, patient and her husband confirmed understanding and are in agreement with plan.   Findings and plan of care discussed with supervising physician Dr. Dayna Barker who personally evaluated and examined this patient and is in agreement.    Final Clinical Impressions(s) / ED Diagnoses    Final diagnoses:  Skin tear of left lower leg without complication, initial encounter    ED Discharge Orders    None       Amaryllis Dyke, PA-C 01/04/18 1615    Mesner, Corene Cornea, MD 01/04/18 2021

## 2018-01-08 ENCOUNTER — Ambulatory Visit (INDEPENDENT_AMBULATORY_CARE_PROVIDER_SITE_OTHER): Payer: Medicare Other | Admitting: Physician Assistant

## 2018-01-08 ENCOUNTER — Encounter: Payer: Self-pay | Admitting: Physician Assistant

## 2018-01-08 VITALS — BP 100/68 | HR 98 | Ht 60.0 in | Wt 106.4 lb

## 2018-01-08 DIAGNOSIS — K52832 Lymphocytic colitis: Secondary | ICD-10-CM

## 2018-01-08 DIAGNOSIS — K580 Irritable bowel syndrome with diarrhea: Secondary | ICD-10-CM | POA: Diagnosis not present

## 2018-01-08 NOTE — Progress Notes (Signed)
Thank you for sending this case to me. I have reviewed the entire note, and the outlined plan seems appropriate.  I agree with a plan to stop the budesonide and see how she does.  If diarrhea worsens, we can resume the medicine.  Wilfrid Lund, MD

## 2018-01-08 NOTE — Patient Instructions (Addendum)
Finish current prescription of Budesonide. Take 5 mg every other day until finished.  Continue Benefiber daily.   Follow up in December / January.   Normal BMI (Body Mass Index- based on height and weight) is between 23 and 30. Your BMI today is Body mass index is 20.78 kg/m. Marland Kitchen Please consider follow up  regarding your BMI with your Primary Care Provider.

## 2018-01-08 NOTE — Progress Notes (Signed)
Subjective:    Patient ID: Ellen Cruz, female    DOB: 09/20/1940, 77 y.o.   MRN: [id number]  HPI Ellen Cruz is a pleasant 77 year old white female, known to Dr. Loletha Carrow and myself comes in today for follow-up of IBS and lymphocytic colitis.  She was last seen about a month ago. She is currently been taking Benefiber 1 scoop daily and budesonide 5 mg every other day.  She still has occasional bouts of diarrhea which are usually precipitated by stress or anxiety.  She states that most days she is doing well and is actually having fairly normal bowel movements. She had an ER visit last week or she injured her shin trying to get into her husband's truck.  That has been stressful for her. TSH was checked at last visit and normal.  Review of Systems Pertinent positive and negative review of systems were noted in the above HPI section.  All other review of systems was otherwise negative.  Outpatient Encounter Medications as of 01/08/2018  Medication Sig  . budesonide (ENTOCORT EC) 3 MG 24 hr capsule Take 5 mg by mouth every other day.  . diazepam (VALIUM) 10 MG tablet Take 10 mg by mouth 3 (three) times daily as needed for anxiety.  . diphenhydrAMINE (BENADRYL) 25 MG tablet Take 25 mg by mouth as needed for allergies.  . hydroxypropyl methylcellulose / hypromellose (ISOPTO TEARS / GONIOVISC) 2.5 % ophthalmic solution Place 1 drop into both eyes as needed for dry eyes.  Marland Kitchen levothyroxine (SYNTHROID, LEVOTHROID) 75 MCG tablet Take 75 mcg by mouth daily.  . [DISCONTINUED] AMBULATORY NON FORMULARY MEDICATION Medication Name: Budesonide 5 mg take 2 tablets by mouth daily   No facility-administered encounter medications on file as of 01/08/2018.    Allergies  Allergen Reactions  . Glycopyrrolate Other (See Comments)    "cotton mouth" and blurred vision  . Asa [Aspirin] Other (See Comments)    unknown  . Codeine Nausea Only  . Lactose Intolerance (Gi)     Causes diarrhea  . Lexapro [Escitalopram  Oxalate] Palpitations  . Phenothiazines Rash  . Statins Other (See Comments)    Muscle aches  . Sulfa Antibiotics Nausea Only  . Zyrtec [Cetirizine] Rash   There are no active problems to display for this patient.  Social History   Socioeconomic History  . Marital status: Married    Spouse name: Not on file  . Number of children: 2  . Years of education: Not on file  . Highest education level: Not on file  Occupational History  . Occupation: retired  Scientific laboratory technician  . Financial resource strain: Not on file  . Food insecurity:    Worry: Not on file    Inability: Not on file  . Transportation needs:    Medical: Not on file    Non-medical: Not on file  Tobacco Use  . Smoking status: Never Smoker  . Smokeless tobacco: Never Used  Substance and Sexual Activity  . Alcohol use: Never    Frequency: Never  . Drug use: Never  . Sexual activity: Not on file  Lifestyle  . Physical activity:    Days per week: Not on file    Minutes per session: Not on file  . Stress: Not on file  Relationships  . Social connections:    Talks on phone: Not on file    Gets together: Not on file    Attends religious service: Not on file    Active member of club or  organization: Not on file    Attends meetings of clubs or organizations: Not on file    Relationship status: Not on file  . Intimate partner violence:    Fear of current or ex partner: Not on file    Emotionally abused: Not on file    Physically abused: Not on file    Forced sexual activity: Not on file  Other Topics Concern  . Not on file  Social History Narrative  . Not on file    Ellen Cruz's family history includes Colon cancer in her father.      Objective:    Vitals:   01/08/18 1512  BP: 100/68  Pulse: 98    Physical Exam Well-developed elderly white female in no acute distress, pleasant she is alone today height 5 foot, weight 106, BMI 20.7 pulse 98.  HEENT; nontraumatic normocephalic EOMI PERRLA sclera  anicteric oral mucosa moist, Cardiovascular; regular rate and rhythm with S1-S2 no murmur rub or gallop, Pulmonary; clear bilaterally, Abdomen; soft, nontender nondistended bowel sounds are active no mass or hepatosplenomegaly, Rectal exam not done, Extremities; she has a bandage on her left shin, Neuro psych alert and oriented, grossly nonfocal mood and affect appropriate ;     Assessment & Plan:   #13 77 year old white female with lymphocytic colitis, and overlap with IBS with diarrhea predominant. Currently doing well on Benefiber once daily and budesonide 5 mg every other day. 2 diverticulosis 3.  Chronic fatigue 4.  Chronic anxiety  Plan; she will finish out her current prescription of budesonide at 5 mg every other day and then have asked her to stop.  She says she has enough medication for at least another month. She will continue Benefiber 1 scoop daily. We will plan to see her back in the office in 3 to 4 months or sooner as needed.  Rilda Bulls S Sanjuana Mruk PA-C 01/08/2018   Cc: [facility]

## 2018-01-10 DIAGNOSIS — Z681 Body mass index (BMI) 19 or less, adult: Secondary | ICD-10-CM | POA: Diagnosis not present

## 2018-01-10 DIAGNOSIS — S81812A Laceration without foreign body, left lower leg, initial encounter: Secondary | ICD-10-CM | POA: Diagnosis not present

## 2018-01-11 DIAGNOSIS — L03116 Cellulitis of left lower limb: Secondary | ICD-10-CM | POA: Diagnosis not present

## 2018-01-11 DIAGNOSIS — Z6820 Body mass index (BMI) 20.0-20.9, adult: Secondary | ICD-10-CM | POA: Diagnosis not present

## 2018-01-15 DIAGNOSIS — L03116 Cellulitis of left lower limb: Secondary | ICD-10-CM | POA: Diagnosis not present

## 2018-01-15 DIAGNOSIS — Z681 Body mass index (BMI) 19 or less, adult: Secondary | ICD-10-CM | POA: Diagnosis not present

## 2018-01-18 DIAGNOSIS — D519 Vitamin B12 deficiency anemia, unspecified: Secondary | ICD-10-CM | POA: Diagnosis not present

## 2018-01-20 DIAGNOSIS — Z681 Body mass index (BMI) 19 or less, adult: Secondary | ICD-10-CM | POA: Diagnosis not present

## 2018-01-20 DIAGNOSIS — S81812A Laceration without foreign body, left lower leg, initial encounter: Secondary | ICD-10-CM | POA: Diagnosis not present

## 2018-01-20 DIAGNOSIS — L03116 Cellulitis of left lower limb: Secondary | ICD-10-CM | POA: Diagnosis not present

## 2018-02-06 DIAGNOSIS — E1122 Type 2 diabetes mellitus with diabetic chronic kidney disease: Secondary | ICD-10-CM | POA: Diagnosis not present

## 2018-02-06 DIAGNOSIS — I10 Essential (primary) hypertension: Secondary | ICD-10-CM | POA: Diagnosis not present

## 2018-02-06 DIAGNOSIS — E1142 Type 2 diabetes mellitus with diabetic polyneuropathy: Secondary | ICD-10-CM | POA: Diagnosis not present

## 2018-02-14 DIAGNOSIS — S81812A Laceration without foreign body, left lower leg, initial encounter: Secondary | ICD-10-CM | POA: Diagnosis not present

## 2018-02-14 DIAGNOSIS — Z681 Body mass index (BMI) 19 or less, adult: Secondary | ICD-10-CM | POA: Diagnosis not present

## 2018-02-14 DIAGNOSIS — L03116 Cellulitis of left lower limb: Secondary | ICD-10-CM | POA: Diagnosis not present

## 2018-02-19 ENCOUNTER — Telehealth: Payer: Self-pay | Admitting: Physician Assistant

## 2018-02-20 NOTE — Telephone Encounter (Signed)
Called back to the patient. Call went to her voicemail. Left message that it is possible. Recommended she contact her provider that manages her diabetes with her blood glucose readings.

## 2018-02-20 NOTE — Telephone Encounter (Signed)
Pt calling her sugar levels, she said that it was 148 this morning and she has not eaten any sweets so wonders it may be the medication she takes. Pls call her.

## 2018-03-05 DIAGNOSIS — I10 Essential (primary) hypertension: Secondary | ICD-10-CM | POA: Diagnosis not present

## 2018-03-05 DIAGNOSIS — E039 Hypothyroidism, unspecified: Secondary | ICD-10-CM | POA: Diagnosis not present

## 2018-03-05 DIAGNOSIS — D519 Vitamin B12 deficiency anemia, unspecified: Secondary | ICD-10-CM | POA: Diagnosis not present

## 2018-03-05 DIAGNOSIS — E782 Mixed hyperlipidemia: Secondary | ICD-10-CM | POA: Diagnosis not present

## 2018-03-05 DIAGNOSIS — K210 Gastro-esophageal reflux disease with esophagitis: Secondary | ICD-10-CM | POA: Diagnosis not present

## 2018-03-05 DIAGNOSIS — N183 Chronic kidney disease, stage 3 (moderate): Secondary | ICD-10-CM | POA: Diagnosis not present

## 2018-03-06 DIAGNOSIS — K580 Irritable bowel syndrome with diarrhea: Secondary | ICD-10-CM | POA: Diagnosis not present

## 2018-03-06 DIAGNOSIS — Z681 Body mass index (BMI) 19 or less, adult: Secondary | ICD-10-CM | POA: Diagnosis not present

## 2018-03-06 DIAGNOSIS — E039 Hypothyroidism, unspecified: Secondary | ICD-10-CM | POA: Diagnosis not present

## 2018-03-06 DIAGNOSIS — Z23 Encounter for immunization: Secondary | ICD-10-CM | POA: Diagnosis not present

## 2018-03-06 DIAGNOSIS — I10 Essential (primary) hypertension: Secondary | ICD-10-CM | POA: Diagnosis not present

## 2018-03-06 DIAGNOSIS — N183 Chronic kidney disease, stage 3 (moderate): Secondary | ICD-10-CM | POA: Diagnosis not present

## 2018-03-06 DIAGNOSIS — E1142 Type 2 diabetes mellitus with diabetic polyneuropathy: Secondary | ICD-10-CM | POA: Diagnosis not present

## 2018-03-06 DIAGNOSIS — E1122 Type 2 diabetes mellitus with diabetic chronic kidney disease: Secondary | ICD-10-CM | POA: Diagnosis not present

## 2018-03-06 DIAGNOSIS — M1711 Unilateral primary osteoarthritis, right knee: Secondary | ICD-10-CM | POA: Diagnosis not present

## 2018-03-07 ENCOUNTER — Encounter: Payer: Self-pay | Admitting: Physician Assistant

## 2018-03-07 NOTE — Telephone Encounter (Signed)
Error

## 2018-03-08 ENCOUNTER — Ambulatory Visit (INDEPENDENT_AMBULATORY_CARE_PROVIDER_SITE_OTHER): Payer: Medicare Other | Admitting: Otolaryngology

## 2018-03-08 DIAGNOSIS — H6123 Impacted cerumen, bilateral: Secondary | ICD-10-CM | POA: Diagnosis not present

## 2018-03-26 ENCOUNTER — Ambulatory Visit (INDEPENDENT_AMBULATORY_CARE_PROVIDER_SITE_OTHER): Payer: Medicare Other | Admitting: Physician Assistant

## 2018-03-26 ENCOUNTER — Encounter: Payer: Self-pay | Admitting: Physician Assistant

## 2018-03-26 VITALS — BP 122/70 | HR 72 | Ht 60.0 in | Wt 103.0 lb

## 2018-03-26 DIAGNOSIS — K580 Irritable bowel syndrome with diarrhea: Secondary | ICD-10-CM

## 2018-03-26 DIAGNOSIS — K52832 Lymphocytic colitis: Secondary | ICD-10-CM

## 2018-03-26 NOTE — Patient Instructions (Addendum)
Stop the Budesonide.  Take Benefiber 2 teaspoons daily in water or Juice.   Follow up as needed. Normal BMI (Body Mass Index- based on height and weight) is between 23 and 30. Your BMI today is Body mass index is 20.12 kg/m. Marland Kitchen Please consider follow up  regarding your BMI with your Primary Care Provider.

## 2018-03-27 DIAGNOSIS — M79672 Pain in left foot: Secondary | ICD-10-CM | POA: Diagnosis not present

## 2018-03-27 DIAGNOSIS — R601 Generalized edema: Secondary | ICD-10-CM | POA: Diagnosis not present

## 2018-03-27 NOTE — Progress Notes (Signed)
Subjective:    Patient ID: Ellen Cruz, female    DOB: 1940-06-17, 77 y.o.   MRN: [id number]  HPI Ellen Cruz is a pleasant 77 year old white female known to Dr. Loletha Carrow and myself.  She was last seen in the office in September 2019.  She comes in today for routine follow-up.  She has history of diarrhea predominant IBS, and lymphocytic colitis.  She has been treated with budesonide in the past with improvement in symptoms.  When she was last seen in September she was doing well on Benefiber and had weaned down to 5 mg of budesonide every other day.  She was told to stop the budesonide at that point. She comes in today stating that she has been doing well overall.  She has no complaints of abdominal pain or cramping and says she thinks the Benefiber works well for her.  She still has days where she will have 3-4 looser bowel movements in a day and these are always days where she is more stressed.  She says she had 4 bowel movements this morning but that was because she was traveling here.  She also mentions that she has ongoing problems with fatigue especially early in the mornings.  She says she used to be someone who would get up and immediately start going and now has a harder time especially early in the mornings.  Review of Systems Pertinent positive and negative review of systems were noted in the above HPI section.  All other review of systems was otherwise negative.  Outpatient Encounter Medications as of 03/26/2018  Medication Sig  . budesonide (ENTOCORT EC) 3 MG 24 hr capsule Take 5 mg by mouth every other day.  . diazepam (VALIUM) 10 MG tablet Take 10 mg by mouth 3 (three) times daily as needed for anxiety.  . diphenhydrAMINE (BENADRYL) 25 MG tablet Take 25 mg by mouth as needed for allergies.  . hydroxypropyl methylcellulose / hypromellose (ISOPTO TEARS / GONIOVISC) 2.5 % ophthalmic solution Place 1 drop into both eyes as needed for dry eyes.  Marland Kitchen levothyroxine (SYNTHROID, LEVOTHROID) 75  MCG tablet Take 75 mcg by mouth daily.  . Wheat Dextrin (BENEFIBER DRINK MIX PO) Take by mouth.   No facility-administered encounter medications on file as of 03/26/2018.    Allergies  Allergen Reactions  . Glycopyrrolate Other (See Comments)    "cotton mouth" and blurred vision  . Asa [Aspirin] Other (See Comments)    unknown  . Codeine Nausea Only  . Lactose Intolerance (Gi)     Causes diarrhea  . Lexapro [Escitalopram Oxalate] Palpitations  . Phenothiazines Rash  . Statins Other (See Comments)    Muscle aches  . Sulfa Antibiotics Nausea Only  . Zyrtec [Cetirizine] Rash   There are no active problems to display for this patient.  Social History   Socioeconomic History  . Marital status: Married    Spouse name: Not on file  . Number of children: 2  . Years of education: Not on file  . Highest education level: Not on file  Occupational History  . Occupation: retired  Scientific laboratory technician  . Financial resource strain: Not on file  . Food insecurity:    Worry: Not on file    Inability: Not on file  . Transportation needs:    Medical: Not on file    Non-medical: Not on file  Tobacco Use  . Smoking status: Never Smoker  . Smokeless tobacco: Never Used  Substance and Sexual Activity  .  Alcohol use: Never    Frequency: Never  . Drug use: Never  . Sexual activity: Not on file  Lifestyle  . Physical activity:    Days per week: Not on file    Minutes per session: Not on file  . Stress: Not on file  Relationships  . Social connections:    Talks on phone: Not on file    Gets together: Not on file    Attends religious service: Not on file    Active member of club or organization: Not on file    Attends meetings of clubs or organizations: Not on file    Relationship status: Not on file  . Intimate partner violence:    Fear of current or ex partner: Not on file    Emotionally abused: Not on file    Physically abused: Not on file    Forced sexual activity: Not on file    Other Topics Concern  . Not on file  Social History Narrative  . Not on file    [name]'s family history includes Colon cancer in her father.      Objective:    Vitals:   03/26/18 1107  BP: 122/70  Pulse: 72    Physical Exam; well-developed elderly white female in no acute distress, pleasant, BMI 20.1.  HEENT; nontraumatic normocephalic EOMI PERRLA sclera anicteric oral mucosa moist, Cardiovascular; regular rate and rhythm with S1-S2 no murmur rub or gallop, Pulmonary ;clear bilaterally, Abdomen; soft, nontender nondistended bowel sounds are active no palpable mass or hepatosplenomegaly, Rectal; exam not done, Extremities ;no clubbing cyanosis or edema, she has some mild swelling laterally on her left ankle, good range of motion.  Neuropsych ;alert and oriented, grossly nonfocal mood and affect appropriate       Assessment & Plan:   63 77 year old white female with IBS and history of lymphocytic colitis. She is currently doing well on Benefiber daily.  She had still been taking budesonide off and on even though she was told to stop it at the time of her last visit.  Her symptoms are most consistent with IBS at this point and have asked her to completely stop the budesonide. #2 chronic fatigue #3.  Chronic anxiety #4.  Diverticulosis  Plan; Patient will continue Benefiber 2 teaspoons daily in water or juice Discontinue budesonide she is asked to call should she have recurrence of significant diarrhea so we can discuss whether or not to resume budesonide and at what dose. She will follow-up with Dr. Loletha Carrow or myself on an as-needed basis.  [name] S [name] PA-C 03/27/2018   Cc: [facility]

## 2018-03-27 NOTE — Progress Notes (Signed)
Thank you for sending this case to me. I have reviewed the entire note, and the outlined plan seems appropriate.  Thanks for the update.  Wilfrid Lund, MD

## 2018-03-27 NOTE — Progress Notes (Signed)
Subjective:    Patient ID: GERARDINE PELTZ, female    DOB: 17-Dec-1940, 77 y.o.   MRN: [id number]  HPI Athenia is a pleasant 77 year old white female, known to Dr. Loletha Carrow and myself with history of diarrhea predominant IBS and lymphocytic colitis.  She comes in today for routine follow-up.  Seen in the office in September 2019 and at that time we had started Benefiber which she was finding beneficial and she had been weaned down to 5 mg of budesonide every other day and was told to stop this.   Review of Systems Pertinent positive and negative review of systems were noted in the above HPI section.  All other review of systems was otherwise negative.  Outpatient Encounter Medications as of 03/26/2018  Medication Sig  . budesonide (ENTOCORT EC) 3 MG 24 hr capsule Take 5 mg by mouth every other day.  . diazepam (VALIUM) 10 MG tablet Take 10 mg by mouth 3 (three) times daily as needed for anxiety.  . diphenhydrAMINE (BENADRYL) 25 MG tablet Take 25 mg by mouth as needed for allergies.  . hydroxypropyl methylcellulose / hypromellose (ISOPTO TEARS / GONIOVISC) 2.5 % ophthalmic solution Place 1 drop into both eyes as needed for dry eyes.  Marland Kitchen levothyroxine (SYNTHROID, LEVOTHROID) 75 MCG tablet Take 75 mcg by mouth daily.  . Wheat Dextrin (BENEFIBER DRINK MIX PO) Take by mouth.   No facility-administered encounter medications on file as of 03/26/2018.    Allergies  Allergen Reactions  . Glycopyrrolate Other (See Comments)    "cotton mouth" and blurred vision  . Asa [Aspirin] Other (See Comments)    unknown  . Codeine Nausea Only  . Lactose Intolerance (Gi)     Causes diarrhea  . Lexapro [Escitalopram Oxalate] Palpitations  . Phenothiazines Rash  . Statins Other (See Comments)    Muscle aches  . Sulfa Antibiotics Nausea Only  . Zyrtec [Cetirizine] Rash   There are no active problems to  display for this patient.  Social History   Socioeconomic History  . Marital status: Married    Spouse name: Not on file  . Number of children: 2  . Years of education: Not on file  . Highest education level: Not on file  Occupational History  . Occupation: retired  Scientific laboratory technician  . Financial resource strain: Not on file  . Food insecurity:    Worry: Not on file    Inability: Not on file  . Transportation needs:    Medical: Not on file    Non-medical: Not on file  Tobacco Use  . Smoking status: Never Smoker  . Smokeless tobacco: Never Used  Substance and Sexual Activity  . Alcohol use: Never    Frequency: Never  . Drug use: Never  . Sexual activity: Not on file  Lifestyle  . Physical activity:    Days per week: Not on file    Minutes per session: Not on file  . Stress: Not on file  Relationships  . Social connections:    Talks on phone: Not on file    Gets together: Not on file    Attends religious service: Not on file    Active member of club  or organization: Not on file    Attends meetings of clubs or organizations: Not on file    Relationship status: Not on file  . Intimate partner violence:    Fear of current or ex partner: Not on file    Emotionally abused: Not on file    Physically abused: Not on file    Forced sexual activity: Not on file  Other Topics Concern  . Not on file  Social History Narrative  . Not on file    Ms. Hottel's family history includes Colon cancer in her father.      Objective:    Vitals:   03/26/18 1107  BP: 122/70  Pulse: 72    Physical Exam; well-developed elderly white female in no acute distress, pleasant, height 5 foot, weight 103, BMI of 20.1.  HEENT; nontraumatic normocephalic EOMI PERRLA [place] moist, Cardiovascular ;regular rate and rhythm with S1-S2 no murmur rub gallop, Pulmonary; clear bilaterally, Abdomen soft, nontender nondistended bowel sounds are active there is no palpable mass or hepatosplenomegaly Rectal;  exam not done, Neuro psych ;alert and oriented, grossly nonfocal mood and affect appropriate       Assessment & Plan:     Alfredia Ferguson PA-C 03/27/2018   Cc: [facility]

## 2018-04-24 DIAGNOSIS — Z681 Body mass index (BMI) 19 or less, adult: Secondary | ICD-10-CM | POA: Diagnosis not present

## 2018-04-24 DIAGNOSIS — J029 Acute pharyngitis, unspecified: Secondary | ICD-10-CM | POA: Diagnosis not present

## 2018-04-24 DIAGNOSIS — I739 Peripheral vascular disease, unspecified: Secondary | ICD-10-CM | POA: Diagnosis not present

## 2018-05-09 ENCOUNTER — Telehealth: Payer: Self-pay | Admitting: Physician Assistant

## 2018-05-09 NOTE — Telephone Encounter (Signed)
No answer. Left message.

## 2018-05-09 NOTE — Telephone Encounter (Signed)
Pt reported that her bowels are good but she is having difficulty swallowing.  She states that she "nearly choked to death"  When swallowing her meds.  Pt reported weight loss and no appetite.  Pt requested to be sched for EGD to be done at [place].

## 2018-05-10 ENCOUNTER — Ambulatory Visit (INDEPENDENT_AMBULATORY_CARE_PROVIDER_SITE_OTHER): Payer: Medicare Other | Admitting: Otolaryngology

## 2018-05-10 DIAGNOSIS — H6123 Impacted cerumen, bilateral: Secondary | ICD-10-CM | POA: Diagnosis not present

## 2018-05-10 DIAGNOSIS — R1312 Dysphagia, oropharyngeal phase: Secondary | ICD-10-CM

## 2018-05-11 NOTE — Telephone Encounter (Signed)
Spoke with the patient. She had seen her ENT. He told her she should "drink a whole bunch of water when you eat. But I already drink a whole bunch of water." Discussed appointment to see provider. She says she is not having any problems today. She wants to wait to see if it happens anymore and will let us know if she wants to be seen. Discussed small bites, well chewed with swallows of fluid between each bite. Take pills one at a time. Place the pill in applesauce to swallow.

## 2018-05-16 ENCOUNTER — Telehealth: Payer: Self-pay | Admitting: Physician Assistant

## 2018-05-16 NOTE — Telephone Encounter (Signed)
Pt would like to speak with you, she stated that she has been having diarrhea lately.

## 2018-05-17 DIAGNOSIS — M79672 Pain in left foot: Secondary | ICD-10-CM | POA: Diagnosis not present

## 2018-05-17 DIAGNOSIS — Z681 Body mass index (BMI) 19 or less, adult: Secondary | ICD-10-CM | POA: Diagnosis not present

## 2018-05-17 DIAGNOSIS — S93602A Unspecified sprain of left foot, initial encounter: Secondary | ICD-10-CM | POA: Diagnosis not present

## 2018-05-18 NOTE — Telephone Encounter (Signed)
Left message to call if she was still having problems.

## 2018-05-21 NOTE — Telephone Encounter (Signed)
I would like her to continue Benefiber - ok to skip for a day  , whenever she has has a day with more frequent Bm's - it is not hurting her to have more frequent stools -if not diarrheal  Tell her not to stress about it!

## 2018-05-21 NOTE — Telephone Encounter (Signed)
Patient is instructed. 

## 2018-05-21 NOTE — Telephone Encounter (Signed)
Spoke with the patient. She reports 4 bowel movements yesterday. She has had breakfast this morning but has not had a bowel movement. She did not take her Benefiber today. She is concerned the Benefiber is causing her bowel movements. Through questioning she admits she is not having watery or loose stools. She is having frequent soft bowel movements. Please advise.

## 2018-05-31 ENCOUNTER — Telehealth: Payer: Self-pay | Admitting: Physician Assistant

## 2018-05-31 NOTE — Telephone Encounter (Signed)
Left message for pt to return call.  Is pt still taking Benefiber?  Does she still have loose stools?

## 2018-05-31 NOTE — Telephone Encounter (Signed)
Pt asked if she can take Culturelle.

## 2018-06-01 DIAGNOSIS — E782 Mixed hyperlipidemia: Secondary | ICD-10-CM | POA: Diagnosis not present

## 2018-06-01 DIAGNOSIS — R5383 Other fatigue: Secondary | ICD-10-CM | POA: Diagnosis not present

## 2018-06-01 DIAGNOSIS — K210 Gastro-esophageal reflux disease with esophagitis: Secondary | ICD-10-CM | POA: Diagnosis not present

## 2018-06-01 DIAGNOSIS — I10 Essential (primary) hypertension: Secondary | ICD-10-CM | POA: Diagnosis not present

## 2018-06-01 DIAGNOSIS — E1165 Type 2 diabetes mellitus with hyperglycemia: Secondary | ICD-10-CM | POA: Diagnosis not present

## 2018-06-01 NOTE — Telephone Encounter (Signed)
Spoke with the patient. She is presently without diarrhea. She states she is having faily regular formed bowel movements. She has a routine of taking Benefiber PRN. Avoiding trigger foods such as salads. A friend wanted her to try Culturelle. She does not want to because she is doing well. She thinks she may have tried it in the past without improvement and maybe worsening of her diarrhea. Strongly encouraged her to maintain her current regimen if she is symptom free. Applauded her efforts of being cautious with her diet since she has realized it does seem to affect her bowels. Thanks me for the call.

## 2018-06-05 DIAGNOSIS — R601 Generalized edema: Secondary | ICD-10-CM | POA: Diagnosis not present

## 2018-06-05 DIAGNOSIS — M79672 Pain in left foot: Secondary | ICD-10-CM | POA: Diagnosis not present

## 2018-06-06 DIAGNOSIS — I10 Essential (primary) hypertension: Secondary | ICD-10-CM | POA: Diagnosis not present

## 2018-06-06 DIAGNOSIS — Z0001 Encounter for general adult medical examination with abnormal findings: Secondary | ICD-10-CM | POA: Diagnosis not present

## 2018-06-06 DIAGNOSIS — Z681 Body mass index (BMI) 19 or less, adult: Secondary | ICD-10-CM | POA: Diagnosis not present

## 2018-06-22 ENCOUNTER — Telehealth: Payer: Self-pay | Admitting: Physician Assistant

## 2018-06-22 NOTE — Telephone Encounter (Signed)
Noted  

## 2018-06-22 NOTE — Telephone Encounter (Signed)
After speaking to her for a few minutes I was able to figure out that she just needed an appointment. This has been scheduled for Friday 07-20-2018. She tells me her spouse has way too many appointments and she is just not able to come in at all until April.

## 2018-06-26 DIAGNOSIS — Z23 Encounter for immunization: Secondary | ICD-10-CM | POA: Diagnosis not present

## 2018-07-07 DIAGNOSIS — M25511 Pain in right shoulder: Secondary | ICD-10-CM | POA: Diagnosis not present

## 2018-07-07 DIAGNOSIS — S51812A Laceration without foreign body of left forearm, initial encounter: Secondary | ICD-10-CM | POA: Diagnosis not present

## 2018-07-07 DIAGNOSIS — Z681 Body mass index (BMI) 19 or less, adult: Secondary | ICD-10-CM | POA: Diagnosis not present

## 2018-07-07 DIAGNOSIS — M7551 Bursitis of right shoulder: Secondary | ICD-10-CM | POA: Diagnosis not present

## 2018-07-09 ENCOUNTER — Ambulatory Visit (INDEPENDENT_AMBULATORY_CARE_PROVIDER_SITE_OTHER): Payer: Medicare Other | Admitting: Otolaryngology

## 2018-07-09 DIAGNOSIS — H6123 Impacted cerumen, bilateral: Secondary | ICD-10-CM | POA: Diagnosis not present

## 2018-07-12 DIAGNOSIS — Z6821 Body mass index (BMI) 21.0-21.9, adult: Secondary | ICD-10-CM | POA: Diagnosis not present

## 2018-07-12 DIAGNOSIS — J019 Acute sinusitis, unspecified: Secondary | ICD-10-CM | POA: Diagnosis not present

## 2018-07-20 ENCOUNTER — Ambulatory Visit: Payer: Medicare Other | Admitting: Gastroenterology

## 2018-07-30 DIAGNOSIS — M25511 Pain in right shoulder: Secondary | ICD-10-CM | POA: Diagnosis not present

## 2018-08-15 ENCOUNTER — Telehealth: Payer: Self-pay | Admitting: Physician Assistant

## 2018-08-15 NOTE — Telephone Encounter (Signed)
Left message

## 2018-08-16 DIAGNOSIS — M79676 Pain in unspecified toe(s): Secondary | ICD-10-CM | POA: Diagnosis not present

## 2018-08-16 DIAGNOSIS — B351 Tinea unguium: Secondary | ICD-10-CM | POA: Diagnosis not present

## 2018-08-16 DIAGNOSIS — L84 Corns and callosities: Secondary | ICD-10-CM | POA: Diagnosis not present

## 2018-08-16 DIAGNOSIS — I70203 Unspecified atherosclerosis of native arteries of extremities, bilateral legs: Secondary | ICD-10-CM | POA: Diagnosis not present

## 2018-08-16 NOTE — Telephone Encounter (Signed)
Left message of returned call

## 2018-08-22 NOTE — Telephone Encounter (Signed)
Spoke with the patient at length. Chronic diarrhea. Cycles between diarrhea, normal stool then constipation. Constipation is less often. She finds the Benefiber slows her bowels down to the point she has constipation so she does not take it very often. Blames her diarrhea on her anxiety which she says has been high "even for me." Discussed diet. Discussed the current COVID 19 situation. End of conversation the patient has agreed to take Benefiber until she has a firming of her stool but to stop before she becomes constipated.

## 2018-08-28 DIAGNOSIS — E1122 Type 2 diabetes mellitus with diabetic chronic kidney disease: Secondary | ICD-10-CM | POA: Diagnosis not present

## 2018-08-28 DIAGNOSIS — K210 Gastro-esophageal reflux disease with esophagitis: Secondary | ICD-10-CM | POA: Diagnosis not present

## 2018-08-28 DIAGNOSIS — N183 Chronic kidney disease, stage 3 (moderate): Secondary | ICD-10-CM | POA: Diagnosis not present

## 2018-08-28 DIAGNOSIS — E039 Hypothyroidism, unspecified: Secondary | ICD-10-CM | POA: Diagnosis not present

## 2018-08-28 DIAGNOSIS — I10 Essential (primary) hypertension: Secondary | ICD-10-CM | POA: Diagnosis not present

## 2018-08-31 DIAGNOSIS — E039 Hypothyroidism, unspecified: Secondary | ICD-10-CM | POA: Diagnosis not present

## 2018-08-31 DIAGNOSIS — G252 Other specified forms of tremor: Secondary | ICD-10-CM | POA: Diagnosis not present

## 2018-08-31 DIAGNOSIS — Z23 Encounter for immunization: Secondary | ICD-10-CM | POA: Diagnosis not present

## 2018-08-31 DIAGNOSIS — E1122 Type 2 diabetes mellitus with diabetic chronic kidney disease: Secondary | ICD-10-CM | POA: Diagnosis not present

## 2018-08-31 DIAGNOSIS — F332 Major depressive disorder, recurrent severe without psychotic features: Secondary | ICD-10-CM | POA: Diagnosis not present

## 2018-08-31 DIAGNOSIS — Z681 Body mass index (BMI) 19 or less, adult: Secondary | ICD-10-CM | POA: Diagnosis not present

## 2018-08-31 DIAGNOSIS — I10 Essential (primary) hypertension: Secondary | ICD-10-CM | POA: Diagnosis not present

## 2018-08-31 DIAGNOSIS — F331 Major depressive disorder, recurrent, moderate: Secondary | ICD-10-CM | POA: Diagnosis not present

## 2018-09-03 ENCOUNTER — Telehealth: Payer: Self-pay | Admitting: Physician Assistant

## 2018-09-03 ENCOUNTER — Other Ambulatory Visit: Payer: Self-pay | Admitting: Family Medicine

## 2018-09-03 ENCOUNTER — Other Ambulatory Visit (HOSPITAL_COMMUNITY): Payer: Self-pay | Admitting: Family Medicine

## 2018-09-03 DIAGNOSIS — R634 Abnormal weight loss: Secondary | ICD-10-CM

## 2018-09-03 NOTE — Telephone Encounter (Signed)
Pt called and wanitng to know where do she go to pick up the prep for the procd that is sched 09/06/2018

## 2018-09-04 NOTE — Telephone Encounter (Signed)
A doctor in [place] schedule this imaging. I can see she has a CT scheduled. I have instructed her to call that doctor's office for her details and instructions.

## 2018-09-06 ENCOUNTER — Ambulatory Visit (HOSPITAL_COMMUNITY)
Admission: RE | Admit: 2018-09-06 | Discharge: 2018-09-06 | Disposition: A | Discharge status: Home / Self Care | Payer: Medicare Other | Source: Ambulatory Visit | Attending: Family Medicine | Admitting: Family Medicine

## 2018-09-06 ENCOUNTER — Other Ambulatory Visit: Payer: Self-pay

## 2018-09-06 DIAGNOSIS — K5730 Diverticulosis of large intestine without perforation or abscess without bleeding: Secondary | ICD-10-CM | POA: Diagnosis not present

## 2018-09-06 DIAGNOSIS — R634 Abnormal weight loss: Secondary | ICD-10-CM | POA: Diagnosis not present

## 2018-09-13 ENCOUNTER — Other Ambulatory Visit: Payer: Self-pay

## 2018-09-13 ENCOUNTER — Ambulatory Visit (INDEPENDENT_AMBULATORY_CARE_PROVIDER_SITE_OTHER): Payer: Medicare Other | Admitting: Otolaryngology

## 2018-09-13 DIAGNOSIS — H6123 Impacted cerumen, bilateral: Secondary | ICD-10-CM

## 2018-09-14 DIAGNOSIS — F419 Anxiety disorder, unspecified: Secondary | ICD-10-CM | POA: Diagnosis not present

## 2018-09-14 DIAGNOSIS — F331 Major depressive disorder, recurrent, moderate: Secondary | ICD-10-CM | POA: Diagnosis not present

## 2018-09-14 DIAGNOSIS — Z681 Body mass index (BMI) 19 or less, adult: Secondary | ICD-10-CM | POA: Diagnosis not present

## 2018-09-27 ENCOUNTER — Telehealth: Payer: Self-pay | Admitting: Physician Assistant

## 2018-09-27 NOTE — Telephone Encounter (Signed)
Pt requested prescription for budesonide 3 mg to be sent to [facility] so that she can get the medication for free.

## 2018-10-01 NOTE — Telephone Encounter (Signed)
Patient is following up on the previous msg

## 2018-10-02 NOTE — Telephone Encounter (Signed)
Fax [phone] for [facility] and would like for it to me 3mg . Patient would like confirmation when it gets sent.

## 2018-10-03 NOTE — Telephone Encounter (Signed)
Pt left the fax number: [phone] for [facility]

## 2018-10-03 NOTE — Telephone Encounter (Signed)
Ok to restart budesonide 5 mg po daily. #30/3 refills

## 2018-10-05 ENCOUNTER — Other Ambulatory Visit: Payer: Self-pay

## 2018-10-05 MED ORDER — BUDESONIDE 3 MG PO CPEP: 3.0000 mg | ORAL_CAPSULE | Freq: Every day | ORAL | 3 refills | Status: DC

## 2018-10-15 ENCOUNTER — Telehealth: Payer: Self-pay | Admitting: Physician Assistant

## 2018-10-15 NOTE — Telephone Encounter (Signed)
Patient called said that her PCP put her on prednisone for her shoulder rotator cuff that is hurting her and does not want to take the budesonide for now. Would like to speak to someone first.

## 2018-10-17 NOTE — Telephone Encounter (Signed)
Patient calling back regarding this. Best # [phone]

## 2018-10-18 NOTE — Telephone Encounter (Signed)
Returned pts call. Per Dr Tarri Glenn, it is ok for pt to wait until she has finished her taper dose of prednisone, before she starts her Budesonide. Pt verbalized understanding.

## 2018-10-18 NOTE — Telephone Encounter (Signed)
Patient has been calling all week on previous msg. She would like a call back.

## 2018-10-22 ENCOUNTER — Telehealth: Payer: Self-pay | Admitting: Physician Assistant

## 2018-10-22 NOTE — Telephone Encounter (Signed)
Patient called in wanting to speak with the nurse.she stated she got an email from [employer] about taking an urine protein test however she is wanting to know what it is for. She would like for the nurse to call.

## 2018-10-22 NOTE — Telephone Encounter (Signed)
Patient has looked at the "todo" list in her patient My Chart account. It is listing things that are the recommended part of her health maintenance. She is upset because she has had these things taken care of. Her PCP is on a different EMR which does not flow over into Epic.  Reassured the patient. If she knows she is up to date or has questions about if she is up to date on her immunizations and screenings, she should ask her PCP.

## 2018-10-25 DIAGNOSIS — I70203 Unspecified atherosclerosis of native arteries of extremities, bilateral legs: Secondary | ICD-10-CM | POA: Diagnosis not present

## 2018-10-25 DIAGNOSIS — L84 Corns and callosities: Secondary | ICD-10-CM | POA: Diagnosis not present

## 2018-10-25 DIAGNOSIS — B351 Tinea unguium: Secondary | ICD-10-CM | POA: Diagnosis not present

## 2018-10-25 DIAGNOSIS — M79676 Pain in unspecified toe(s): Secondary | ICD-10-CM | POA: Diagnosis not present

## 2018-11-12 DIAGNOSIS — Z681 Body mass index (BMI) 19 or less, adult: Secondary | ICD-10-CM | POA: Diagnosis not present

## 2018-11-12 DIAGNOSIS — D649 Anemia, unspecified: Secondary | ICD-10-CM | POA: Diagnosis not present

## 2018-11-12 DIAGNOSIS — D519 Vitamin B12 deficiency anemia, unspecified: Secondary | ICD-10-CM | POA: Diagnosis not present

## 2018-11-12 DIAGNOSIS — R5383 Other fatigue: Secondary | ICD-10-CM | POA: Diagnosis not present

## 2018-11-15 ENCOUNTER — Ambulatory Visit (INDEPENDENT_AMBULATORY_CARE_PROVIDER_SITE_OTHER): Payer: Medicare Other | Admitting: Otolaryngology

## 2018-11-15 DIAGNOSIS — H6123 Impacted cerumen, bilateral: Secondary | ICD-10-CM | POA: Diagnosis not present

## 2018-11-21 ENCOUNTER — Telehealth: Payer: Self-pay | Admitting: Physician Assistant

## 2018-11-21 NOTE — Telephone Encounter (Signed)
Called patient and answered her questions about Budesonide; that she does take it once a day and I called "[facility]" and they just delivered 1 months supply 2 days ago and she still has 1 refill

## 2018-12-17 ENCOUNTER — Telehealth: Payer: Self-pay | Admitting: Physician Assistant

## 2018-12-17 ENCOUNTER — Other Ambulatory Visit: Payer: Self-pay

## 2018-12-17 MED ORDER — BUDESONIDE 3 MG PO CPEP: 3.0000 mg | ORAL_CAPSULE | Freq: Every day | ORAL | 3 refills | Status: DC

## 2018-12-17 NOTE — Telephone Encounter (Signed)
Medication refilled at the present dosage.

## 2018-12-18 DIAGNOSIS — K210 Gastro-esophageal reflux disease with esophagitis: Secondary | ICD-10-CM | POA: Diagnosis not present

## 2018-12-18 DIAGNOSIS — D649 Anemia, unspecified: Secondary | ICD-10-CM | POA: Diagnosis not present

## 2018-12-18 DIAGNOSIS — E782 Mixed hyperlipidemia: Secondary | ICD-10-CM | POA: Diagnosis not present

## 2018-12-18 DIAGNOSIS — E538 Deficiency of other specified B group vitamins: Secondary | ICD-10-CM | POA: Diagnosis not present

## 2018-12-18 DIAGNOSIS — R5383 Other fatigue: Secondary | ICD-10-CM | POA: Diagnosis not present

## 2018-12-18 DIAGNOSIS — D519 Vitamin B12 deficiency anemia, unspecified: Secondary | ICD-10-CM | POA: Diagnosis not present

## 2018-12-18 DIAGNOSIS — E1122 Type 2 diabetes mellitus with diabetic chronic kidney disease: Secondary | ICD-10-CM | POA: Diagnosis not present

## 2018-12-18 DIAGNOSIS — I10 Essential (primary) hypertension: Secondary | ICD-10-CM | POA: Diagnosis not present

## 2018-12-26 DIAGNOSIS — E039 Hypothyroidism, unspecified: Secondary | ICD-10-CM | POA: Diagnosis not present

## 2018-12-26 DIAGNOSIS — K219 Gastro-esophageal reflux disease without esophagitis: Secondary | ICD-10-CM | POA: Diagnosis not present

## 2018-12-26 DIAGNOSIS — Z23 Encounter for immunization: Secondary | ICD-10-CM | POA: Diagnosis not present

## 2018-12-26 DIAGNOSIS — N183 Chronic kidney disease, stage 3 (moderate): Secondary | ICD-10-CM | POA: Diagnosis not present

## 2018-12-26 DIAGNOSIS — F411 Generalized anxiety disorder: Secondary | ICD-10-CM | POA: Diagnosis not present

## 2018-12-26 DIAGNOSIS — E782 Mixed hyperlipidemia: Secondary | ICD-10-CM | POA: Diagnosis not present

## 2018-12-26 DIAGNOSIS — F332 Major depressive disorder, recurrent severe without psychotic features: Secondary | ICD-10-CM | POA: Diagnosis not present

## 2018-12-26 DIAGNOSIS — F331 Major depressive disorder, recurrent, moderate: Secondary | ICD-10-CM | POA: Diagnosis not present

## 2018-12-26 DIAGNOSIS — E440 Moderate protein-calorie malnutrition: Secondary | ICD-10-CM | POA: Diagnosis not present

## 2018-12-26 DIAGNOSIS — E1122 Type 2 diabetes mellitus with diabetic chronic kidney disease: Secondary | ICD-10-CM | POA: Diagnosis not present

## 2018-12-26 DIAGNOSIS — J301 Allergic rhinitis due to pollen: Secondary | ICD-10-CM | POA: Diagnosis not present

## 2018-12-26 DIAGNOSIS — K580 Irritable bowel syndrome with diarrhea: Secondary | ICD-10-CM | POA: Diagnosis not present

## 2018-12-26 DIAGNOSIS — I10 Essential (primary) hypertension: Secondary | ICD-10-CM | POA: Diagnosis not present

## 2018-12-26 DIAGNOSIS — Z1212 Encounter for screening for malignant neoplasm of rectum: Secondary | ICD-10-CM | POA: Diagnosis not present

## 2018-12-26 DIAGNOSIS — Z681 Body mass index (BMI) 19 or less, adult: Secondary | ICD-10-CM | POA: Diagnosis not present

## 2018-12-31 DIAGNOSIS — H8111 Benign paroxysmal vertigo, right ear: Secondary | ICD-10-CM | POA: Diagnosis not present

## 2019-01-03 DIAGNOSIS — B351 Tinea unguium: Secondary | ICD-10-CM | POA: Diagnosis not present

## 2019-01-03 DIAGNOSIS — M79676 Pain in unspecified toe(s): Secondary | ICD-10-CM | POA: Diagnosis not present

## 2019-01-03 DIAGNOSIS — L84 Corns and callosities: Secondary | ICD-10-CM | POA: Diagnosis not present

## 2019-01-03 DIAGNOSIS — E1142 Type 2 diabetes mellitus with diabetic polyneuropathy: Secondary | ICD-10-CM | POA: Diagnosis not present

## 2019-01-08 DIAGNOSIS — H8111 Benign paroxysmal vertigo, right ear: Secondary | ICD-10-CM | POA: Diagnosis not present

## 2019-01-10 ENCOUNTER — Ambulatory Visit (INDEPENDENT_AMBULATORY_CARE_PROVIDER_SITE_OTHER): Payer: Medicare Other | Admitting: Otolaryngology

## 2019-01-10 DIAGNOSIS — R42 Dizziness and giddiness: Secondary | ICD-10-CM

## 2019-01-10 DIAGNOSIS — H6123 Impacted cerumen, bilateral: Secondary | ICD-10-CM | POA: Diagnosis not present

## 2019-01-11 ENCOUNTER — Telehealth: Payer: Self-pay | Admitting: Physician Assistant

## 2019-01-14 NOTE — Telephone Encounter (Signed)
Spoke to the patient who reported she was told on Friday by one of the schedulers that she had been scheduled for "surgery" but she didn't show up. The patient was frantic and worried about this. I reassured the patient that I could not find anything in her chart to support this claim that she says she was told about a surgery that had been scheduled. The patient did report that she currently has an appointment on 11/2 at 3:40 pm with Dr. Loletha Carrow to discuss possibly having an upper endoscopy. No other questions or concerns voiced at the time of the call.

## 2019-01-14 NOTE — Telephone Encounter (Signed)
Called patient, allowed the phone to ring several times. No answer, no machine, no voicemail, unable to leave message.

## 2019-01-14 NOTE — Telephone Encounter (Signed)
Pt called back regarding this msg

## 2019-02-15 DIAGNOSIS — S81811A Laceration without foreign body, right lower leg, initial encounter: Secondary | ICD-10-CM | POA: Diagnosis not present

## 2019-02-15 DIAGNOSIS — Z681 Body mass index (BMI) 19 or less, adult: Secondary | ICD-10-CM | POA: Diagnosis not present

## 2019-02-18 ENCOUNTER — Ambulatory Visit (INDEPENDENT_AMBULATORY_CARE_PROVIDER_SITE_OTHER): Payer: Medicare Other | Admitting: Gastroenterology

## 2019-02-18 ENCOUNTER — Encounter: Payer: Self-pay | Admitting: Gastroenterology

## 2019-02-18 ENCOUNTER — Other Ambulatory Visit: Payer: Self-pay

## 2019-02-18 VITALS — BP 130/80 | HR 94 | Temp 98.7°F | Ht 60.0 in | Wt 107.0 lb

## 2019-02-18 DIAGNOSIS — Z1159 Encounter for screening for other viral diseases: Secondary | ICD-10-CM

## 2019-02-18 DIAGNOSIS — R1312 Dysphagia, oropharyngeal phase: Secondary | ICD-10-CM

## 2019-02-18 DIAGNOSIS — K52832 Lymphocytic colitis: Secondary | ICD-10-CM

## 2019-02-18 NOTE — Progress Notes (Signed)
[facility] Progress Note  Chief Complaint: Dysphagia.  Subjective  History: Ellen Cruz is known to me for chronic diarrhea from lymphocytic colitis and probable IBS overlay.  She tells me that is under good control on budesonide 3 mg once daily.  She is here for several months of solid and liquid dysphagia.  Sometimes food, pills or even just drinking water will set off a coughing fit.  That might then precipitate feelings of regurgitation of food and liquid coming up with a bitter taste in the throat. Overall her appetite is good and her weight stable as far she can tell.  She denies abdominal pain or nausea.  Past Medical History:  Diagnosis Date  . At risk for falling    has cane  . Chronic kidney disease    stage 3/  per pt  . Diabetes mellitus without complication ([place])    no meds/controlled by diet  . Diarrhea   . Thyroid disease      ROS: Cardiovascular:  no chest pain Respiratory: no dyspnea unless she gets into a coughing fit. Anxiety She has right lower leg pain after a fall laceration recently.  She went to primary care the next day and it was bandaged.  She is also having some left lower back pain that she feels may have been a strain caused by this fall.   Chronic allergic symptoms with postnasal drip  Remainder of systems negative except as above  The patient's Past Medical, Family and Social History were reviewed and are on file in the EMR.  Objective:  Med list reviewed  Current Outpatient Medications:  .  budesonide (ENTOCORT EC) 3 MG 24 hr capsule, Take 1 capsule (3 mg total) by mouth daily., Disp: 30 capsule, Rfl: 3 .  Cholecalciferol (VITAMIN D3) 25 MCG (1000 UT) CAPS, Take 1 capsule by mouth daily., Disp: , Rfl:  .  diazepam (VALIUM) 10 MG tablet, Take 10 mg by mouth 3 (three) times daily as needed for anxiety., Disp: , Rfl:  .  Fexofenadine-Pseudoephedrine (ALLEGRA-D 24 HOUR PO), Take 1 tablet by mouth daily., Disp: , Rfl:  .  furosemide  (LASIX) 40 MG tablet, Take 40 mg by mouth daily., Disp: , Rfl:  .  hydroxypropyl methylcellulose / hypromellose (ISOPTO TEARS / GONIOVISC) 2.5 % ophthalmic solution, Place 1 drop into both eyes as needed for dry eyes., Disp: , Rfl:  .  levothyroxine (SYNTHROID, LEVOTHROID) 75 MCG tablet, Take 75 mcg by mouth daily., Disp: , Rfl:  .  meclizine (ANTIVERT) 12.5 MG tablet, Take 12.5 mg by mouth 3 (three) times daily as needed for dizziness., Disp: , Rfl:    Vital signs in last 24 hrs: Vitals:   02/18/19 1541  BP: 130/80  Pulse: 94  Temp: 98.7 F (37.1 C)    Physical Exam  Normal vocal quality  HEENT: sclera anicteric, oral mucosa moist without lesions  Neck: supple, no thyromegaly, JVD or lymphadenopathy  Cardiac: RRR without murmurs, S1S2 heard, no peripheral edema  Pulm: clear to auscultation bilaterally, normal RR and effort noted  Abdomen: soft, no tenderness, with active bowel sounds. No guarding or palpable hepatosplenomegaly.  Skin; warm and dry, no jaundice or rash   @ASSESSMENTPLANBEGIN @ Assessment: Encounter Diagnoses  Name Primary?  Marland Kitchen Oropharyngeal dysphagia Yes  . Lymphocytic colitis   . Screening for viral disease    Her diarrhea and colitis seem under good control.  Oral pharyngeal dysphagia with both solid and liquid dysphagia, suggesting possible motility issue rather  than stricture.  Nevertheless, recent onset and severity of symptoms warrant endoscopic evaluation.  She recalls having an upper endoscopy twice in the past with a physician in [place], and thinks it was done for similar symptoms.   Plan: EGD with possible dilation.  She is agreeable after discussion of procedure and risks.  The benefits and risks of the planned procedure were described in detail with the patient or (when appropriate) their health care proxy.  Risks were outlined as including, but not limited to, bleeding, infection, perforation, adverse medication reaction leading to cardiac or  pulmonary decompensation, pancreatitis (if ERCP).  The limitation of incomplete mucosal visualization was also discussed.  No guarantees or warranties were given.   Ellen Cruz

## 2019-02-18 NOTE — Patient Instructions (Signed)
If you are age 79 or older, your body mass index should be between 23-30. Your Body mass index is 20.9 kg/m. If this is out of the aforementioned range listed, please consider follow up with your Primary Care Provider.  If you are age 42 or younger, your body mass index should be between 19-25. Your Body mass index is 20.9 kg/m. If this is out of the aformentioned range listed, please consider follow up with your Primary Care Provider.   You have been scheduled for an endoscopy. Please follow written instructions given to you at your visit today. If you use inhalers (even only as needed), please bring them with you on the day of your procedure. Your physician has requested that you go to www.startemmi.com and enter the access code given to you at your visit today. This web site gives a general overview about your procedure. However, you should still follow specific instructions given to you by our office regarding your preparation for the procedure.  It was a pleasure to see you today!  Dr. Loletha Carrow

## 2019-03-05 ENCOUNTER — Other Ambulatory Visit: Payer: Self-pay

## 2019-03-05 ENCOUNTER — Telehealth: Payer: Self-pay | Admitting: Gastroenterology

## 2019-03-05 MED ORDER — BUDESONIDE 3 MG PO CPEP: 3.0000 mg | ORAL_CAPSULE | Freq: Every day | ORAL | 3 refills | Status: DC

## 2019-03-05 NOTE — Telephone Encounter (Signed)
90 days supply refilled

## 2019-03-07 ENCOUNTER — Other Ambulatory Visit: Payer: Self-pay

## 2019-03-07 ENCOUNTER — Ambulatory Visit (INDEPENDENT_AMBULATORY_CARE_PROVIDER_SITE_OTHER): Payer: Medicare Other | Admitting: Otolaryngology

## 2019-03-08 ENCOUNTER — Ambulatory Visit (INDEPENDENT_AMBULATORY_CARE_PROVIDER_SITE_OTHER): Payer: Medicare Other

## 2019-03-08 ENCOUNTER — Other Ambulatory Visit: Payer: Self-pay | Admitting: Gastroenterology

## 2019-03-08 DIAGNOSIS — Z1159 Encounter for screening for other viral diseases: Secondary | ICD-10-CM

## 2019-03-11 LAB — SARS CORONAVIRUS 2 (TAT 6-24 HRS): SARS Coronavirus 2: NEGATIVE

## 2019-03-12 ENCOUNTER — Encounter: Payer: Self-pay | Admitting: Gastroenterology

## 2019-03-12 ENCOUNTER — Ambulatory Visit (AMBULATORY_SURGERY_CENTER): Payer: Medicare Other | Admitting: Gastroenterology

## 2019-03-12 ENCOUNTER — Other Ambulatory Visit: Payer: Self-pay

## 2019-03-12 VITALS — BP 150/88 | HR 75 | Temp 98.0°F | Resp 15 | Ht 60.0 in | Wt 107.0 lb

## 2019-03-12 DIAGNOSIS — R1310 Dysphagia, unspecified: Secondary | ICD-10-CM

## 2019-03-12 DIAGNOSIS — N183 Chronic kidney disease, stage 3 unspecified: Secondary | ICD-10-CM | POA: Diagnosis not present

## 2019-03-12 DIAGNOSIS — E1122 Type 2 diabetes mellitus with diabetic chronic kidney disease: Secondary | ICD-10-CM | POA: Diagnosis not present

## 2019-03-12 MED ORDER — SODIUM CHLORIDE 0.9 % IV SOLN: 500.0000 mL | Freq: Once | INTRAVENOUS | Status: DC

## 2019-03-12 NOTE — Patient Instructions (Signed)
See handouts on chin-tuck maneuver and presbyesophagus        YOU HAD AN ENDOSCOPIC PROCEDURE TODAY AT [facility]:   Refer to the procedure report that was given to you for any specific questions about what was found during the examination.  If the procedure report does not answer your questions, please call your gastroenterologist to clarify.  If you requested that your care partner not be given the details of your procedure findings, then the procedure report has been included in a sealed envelope for you to review at your convenience later.  YOU SHOULD EXPECT: Some feelings of bloating in the abdomen. Passage of more gas than usual.  Walking can help get rid of the air that was put into your GI tract during the procedure and reduce the bloating. If you had a lower endoscopy (such as a colonoscopy or flexible sigmoidoscopy) you may notice spotting of blood in your stool or on the toilet paper. If you underwent a bowel prep for your procedure, you may not have a normal bowel movement for a few days.  Please Note:  You might notice some irritation and congestion in your nose or some drainage.  This is from the oxygen used during your procedure.  There is no need for concern and it should clear up in a day or so.  SYMPTOMS TO REPORT IMMEDIATELY:    Following upper endoscopy (EGD)  Vomiting of blood or coffee ground material  New chest pain or pain under the shoulder blades  Painful or persistently difficult swallowing  New shortness of breath  Fever of 100F or higher  Black, tarry-looking stools  For urgent or emergent issues, a gastroenterologist can be reached at any hour by calling [phone].   DIET:  We do recommend a small meal at first, but then you may proceed to your regular diet.  Drink plenty of fluids but you should avoid alcoholic beverages for 24 hours.  ACTIVITY:  You should plan to take it easy for the rest of today and you should NOT DRIVE or use  heavy machinery until tomorrow (because of the sedation medicines used during the test).    FOLLOW UP: Our staff will call the number listed on your records 48-72 hours following your procedure to check on you and address any questions or concerns that you may have regarding the information given to you following your procedure. If we do not reach you, we will leave a message.  We will attempt to reach you two times.  During this call, we will ask if you have developed any symptoms of COVID 19. If you develop any symptoms (ie: fever, flu-like symptoms, shortness of breath, cough etc.) before then, please call [phone].  If you test positive for Covid 19 in the 2 weeks post procedure, please call and report this information to [place].    If any biopsies were taken you will be contacted by phone or by letter within the next 1-3 weeks.  Please call us at [phone] if you have not heard about the biopsies in 3 weeks.    SIGNATURES/CONFIDENTIALITY: You and/or your care partner have signed paperwork which will be entered into your electronic medical record.  These signatures attest to the fact that that the information above on your After Visit Summary has been reviewed and is understood.  Full responsibility of the confidentiality of this discharge information lies with you and/or your care-partner.

## 2019-03-12 NOTE — Op Note (Signed)
[facility] Patient Name: Ellen Cruz Procedure Date: 03/12/2019 2:51 PM MRN: [id number] Endoscopist: Mallie Mussel L. Loletha Carrow , MD Age: 78 Referring MD:  Date of Birth: [birth year] Gender: Female Account #: [id number] Procedure:                Upper GI endoscopy Indications:              Oropharyngeal phase dysphagia (solids, liquids,                            pills - > intermittent coughing) Medicines:                Monitored Anesthesia Care Procedure:                Pre-Anesthesia Assessment:                           - Prior to the procedure, a History and Physical                            was performed, and patient medications and                            allergies were reviewed. The patient's tolerance of                            previous anesthesia was also reviewed. The risks                            and benefits of the procedure and the sedation                            options and risks were discussed with the patient.                            All questions were answered, and informed consent                            was obtained. Prior Anticoagulants: The patient has                            taken no previous anticoagulant or antiplatelet                            agents. ASA Grade Assessment: III - A patient with                            severe systemic disease. After reviewing the risks                            and benefits, the patient was deemed in                            satisfactory condition to undergo the procedure.  After obtaining informed consent, the endoscope was                            passed under direct vision. Throughout the                            procedure, the patient's blood pressure, pulse, and                            oxygen saturations were monitored continuously. The                            Endoscope was introduced through the mouth, and                            advanced to the  second part of duodenum. The upper                            GI endoscopy was accomplished without difficulty.                            The patient tolerated the procedure well. Scope In: Scope Out: Findings:                 The larynx was normal.                           The esophagus was normal.                           The stomach was normal.                           The cardia and gastric fundus were normal on                            retroflexion.                           The examined duodenum was normal. Complications:            No immediate complications. Estimated Blood Loss:     Estimated blood loss: none. Impression:               - Normal larynx.                           - Normal esophagus. No findings amenable to                            dilation.                           - Normal stomach.                           - Normal examined duodenum.                           -  No specimens collected.                           Presbyesophagus, primarily affecting UES. Recommendation:           - Patient has a contact number available for                            emergencies. The signs and symptoms of potential                            delayed complications were discussed with the                            patient. Return to normal activities tomorrow.                            Written discharge instructions were provided to the                            patient.                           - Resume previous diet.                           - Continue present medications.                           - Chin-tuck maneuver with swallowing. Henry L. Loletha Carrow, MD 03/12/2019 3:09:30 PM This report has been signed electronically.

## 2019-03-12 NOTE — Progress Notes (Signed)
LC - Temp CW - VS   

## 2019-03-12 NOTE — Progress Notes (Signed)
Report given to PACU, vss 

## 2019-03-18 ENCOUNTER — Telehealth: Payer: Self-pay

## 2019-03-18 DIAGNOSIS — E039 Hypothyroidism, unspecified: Secondary | ICD-10-CM | POA: Diagnosis not present

## 2019-03-18 DIAGNOSIS — I10 Essential (primary) hypertension: Secondary | ICD-10-CM | POA: Diagnosis not present

## 2019-03-18 NOTE — Telephone Encounter (Signed)
  Follow up Call-  Call back number 03/12/2019 09/15/2017  Post procedure Call Back phone  # [phone] hm [phone]  Permission to leave phone message No Yes  comments No, answering machine does not work. -  Some recent data might be hidden     Patient questions:  Do you have a fever, pain , or abdominal swelling? No. Pain Score  0 *  Have you tolerated food without any problems? Yes.    Have you been able to return to your normal activities? Yes.    Do you have any questions about your discharge instructions: Diet   No. Medications  No. Follow up visit  No.  Do you have questions or concerns about your Care? No.  Actions: * If pain score is 4 or above: 1. No action needed, pain <4.Have you developed a fever since your procedure? no  2.   Have you had an respiratory symptoms (SOB or cough) since your procedure? no  3.   Have you tested positive for COVID 19 since your procedure no  4.   Have you had any family members/close contacts diagnosed with the COVID 19 since your procedure?  no   If yes to any of these questions please route to Joylene John, RN and Alphonsa Gin, Therapist, sports.

## 2019-03-21 DIAGNOSIS — M79676 Pain in unspecified toe(s): Secondary | ICD-10-CM | POA: Diagnosis not present

## 2019-03-21 DIAGNOSIS — L84 Corns and callosities: Secondary | ICD-10-CM | POA: Diagnosis not present

## 2019-03-21 DIAGNOSIS — E1142 Type 2 diabetes mellitus with diabetic polyneuropathy: Secondary | ICD-10-CM | POA: Diagnosis not present

## 2019-03-21 DIAGNOSIS — B351 Tinea unguium: Secondary | ICD-10-CM | POA: Diagnosis not present

## 2019-03-25 ENCOUNTER — Telehealth: Payer: Self-pay | Admitting: Gastroenterology

## 2019-03-25 ENCOUNTER — Other Ambulatory Visit: Payer: Self-pay

## 2019-03-25 MED ORDER — BUDESONIDE 3 MG PO CPEP: 3.0000 mg | ORAL_CAPSULE | Freq: Every day | ORAL | 3 refills | Status: DC

## 2019-03-25 NOTE — Telephone Encounter (Signed)
Done

## 2019-03-27 DIAGNOSIS — K219 Gastro-esophageal reflux disease without esophagitis: Secondary | ICD-10-CM | POA: Diagnosis not present

## 2019-03-27 DIAGNOSIS — E538 Deficiency of other specified B group vitamins: Secondary | ICD-10-CM | POA: Diagnosis not present

## 2019-03-27 DIAGNOSIS — E039 Hypothyroidism, unspecified: Secondary | ICD-10-CM | POA: Diagnosis not present

## 2019-03-27 DIAGNOSIS — R5383 Other fatigue: Secondary | ICD-10-CM | POA: Diagnosis not present

## 2019-03-27 DIAGNOSIS — E1165 Type 2 diabetes mellitus with hyperglycemia: Secondary | ICD-10-CM | POA: Diagnosis not present

## 2019-03-27 DIAGNOSIS — I10 Essential (primary) hypertension: Secondary | ICD-10-CM | POA: Diagnosis not present

## 2019-03-27 DIAGNOSIS — D649 Anemia, unspecified: Secondary | ICD-10-CM | POA: Diagnosis not present

## 2019-04-01 DIAGNOSIS — E039 Hypothyroidism, unspecified: Secondary | ICD-10-CM | POA: Diagnosis not present

## 2019-04-01 DIAGNOSIS — E440 Moderate protein-calorie malnutrition: Secondary | ICD-10-CM | POA: Diagnosis not present

## 2019-04-01 DIAGNOSIS — Z681 Body mass index (BMI) 19 or less, adult: Secondary | ICD-10-CM | POA: Diagnosis not present

## 2019-04-01 DIAGNOSIS — I10 Essential (primary) hypertension: Secondary | ICD-10-CM | POA: Diagnosis not present

## 2019-04-01 DIAGNOSIS — K580 Irritable bowel syndrome with diarrhea: Secondary | ICD-10-CM | POA: Diagnosis not present

## 2019-04-01 DIAGNOSIS — E1122 Type 2 diabetes mellitus with diabetic chronic kidney disease: Secondary | ICD-10-CM | POA: Diagnosis not present

## 2019-04-01 DIAGNOSIS — G252 Other specified forms of tremor: Secondary | ICD-10-CM | POA: Diagnosis not present

## 2019-04-01 DIAGNOSIS — F331 Major depressive disorder, recurrent, moderate: Secondary | ICD-10-CM | POA: Diagnosis not present

## 2019-04-22 DIAGNOSIS — H04223 Epiphora due to insufficient drainage, bilateral lacrimal glands: Secondary | ICD-10-CM | POA: Diagnosis not present

## 2019-04-22 DIAGNOSIS — Z9849 Cataract extraction status, unspecified eye: Secondary | ICD-10-CM | POA: Diagnosis not present

## 2019-04-22 DIAGNOSIS — Z961 Presence of intraocular lens: Secondary | ICD-10-CM | POA: Diagnosis not present

## 2019-05-02 DIAGNOSIS — H6123 Impacted cerumen, bilateral: Secondary | ICD-10-CM | POA: Diagnosis not present

## 2019-05-17 DIAGNOSIS — I10 Essential (primary) hypertension: Secondary | ICD-10-CM | POA: Diagnosis not present

## 2019-05-17 DIAGNOSIS — E1165 Type 2 diabetes mellitus with hyperglycemia: Secondary | ICD-10-CM | POA: Diagnosis not present

## 2019-05-17 DIAGNOSIS — E7849 Other hyperlipidemia: Secondary | ICD-10-CM | POA: Diagnosis not present

## 2019-05-30 ENCOUNTER — Ambulatory Visit: Payer: Medicare Other

## 2019-05-30 ENCOUNTER — Ambulatory Visit: Payer: Medicare Other | Attending: Internal Medicine

## 2019-05-30 DIAGNOSIS — Z23 Encounter for immunization: Secondary | ICD-10-CM | POA: Insufficient documentation

## 2019-05-30 DIAGNOSIS — L84 Corns and callosities: Secondary | ICD-10-CM | POA: Diagnosis not present

## 2019-05-30 DIAGNOSIS — B351 Tinea unguium: Secondary | ICD-10-CM | POA: Diagnosis not present

## 2019-05-30 DIAGNOSIS — E1142 Type 2 diabetes mellitus with diabetic polyneuropathy: Secondary | ICD-10-CM | POA: Diagnosis not present

## 2019-05-30 DIAGNOSIS — M79676 Pain in unspecified toe(s): Secondary | ICD-10-CM | POA: Diagnosis not present

## 2019-05-30 NOTE — Progress Notes (Signed)
   Covid-19 Vaccination Clinic  Name:  Ellen Cruz    MRN: [id number] DOB: 31-Aug-1940  05/30/2019  [name] was observed post Covid-19 immunization for 15 minutes without incidence. She was provided with Vaccine Information Sheet and instruction to access the V-Safe system.   [name] was instructed to call 911 with any severe reactions post vaccine: Marland Kitchen Difficulty breathing  . Swelling of your face and throat  . A fast heartbeat  . A bad rash all over your body  . Dizziness and weakness    Immunizations Administered    Name Date Dose VIS Date Route   Pfizer COVID-19 Vaccine 05/30/2019  8:39 AM 0.3 mL 03/29/2019 Intramuscular   Manufacturer: [place]   Lot: [id number]   NDC: [id number]

## 2019-06-14 DIAGNOSIS — I10 Essential (primary) hypertension: Secondary | ICD-10-CM | POA: Diagnosis not present

## 2019-06-14 DIAGNOSIS — E7849 Other hyperlipidemia: Secondary | ICD-10-CM | POA: Diagnosis not present

## 2019-06-20 ENCOUNTER — Ambulatory Visit: Payer: Medicare Other | Attending: Internal Medicine

## 2019-06-20 DIAGNOSIS — Z23 Encounter for immunization: Secondary | ICD-10-CM | POA: Insufficient documentation

## 2019-06-20 NOTE — Progress Notes (Signed)
   Covid-19 Vaccination Clinic  Name:  Ellen Cruz    MRN: [id number] DOB: 05-Aug-1940  06/20/2019  [name] was observed post Covid-19 immunization for 15 minutes without incident. She was provided with Vaccine Information Sheet and instruction to access the V-Safe system.   [name] was instructed to call 911 with any severe reactions post vaccine: Marland Kitchen Difficulty breathing  . Swelling of face and throat  . A fast heartbeat  . A bad rash all over body  . Dizziness and weakness   Immunizations Administered    Name Date Dose VIS Date Route   Pfizer COVID-19 Vaccine 06/20/2019  9:19 AM 0.3 mL 03/29/2019 Intramuscular   Manufacturer: [place]   Lot: [id number]   [place]: [id number]

## 2019-07-08 DIAGNOSIS — E1165 Type 2 diabetes mellitus with hyperglycemia: Secondary | ICD-10-CM | POA: Diagnosis not present

## 2019-07-08 DIAGNOSIS — E782 Mixed hyperlipidemia: Secondary | ICD-10-CM | POA: Diagnosis not present

## 2019-07-08 DIAGNOSIS — E1122 Type 2 diabetes mellitus with diabetic chronic kidney disease: Secondary | ICD-10-CM | POA: Diagnosis not present

## 2019-07-08 DIAGNOSIS — R946 Abnormal results of thyroid function studies: Secondary | ICD-10-CM | POA: Diagnosis not present

## 2019-07-08 DIAGNOSIS — I10 Essential (primary) hypertension: Secondary | ICD-10-CM | POA: Diagnosis not present

## 2019-07-08 DIAGNOSIS — K219 Gastro-esophageal reflux disease without esophagitis: Secondary | ICD-10-CM | POA: Diagnosis not present

## 2019-07-08 DIAGNOSIS — E1142 Type 2 diabetes mellitus with diabetic polyneuropathy: Secondary | ICD-10-CM | POA: Diagnosis not present

## 2019-07-10 DIAGNOSIS — E1142 Type 2 diabetes mellitus with diabetic polyneuropathy: Secondary | ICD-10-CM | POA: Diagnosis not present

## 2019-07-10 DIAGNOSIS — E1122 Type 2 diabetes mellitus with diabetic chronic kidney disease: Secondary | ICD-10-CM | POA: Diagnosis not present

## 2019-07-10 DIAGNOSIS — Z0001 Encounter for general adult medical examination with abnormal findings: Secondary | ICD-10-CM | POA: Diagnosis not present

## 2019-07-10 DIAGNOSIS — E782 Mixed hyperlipidemia: Secondary | ICD-10-CM | POA: Diagnosis not present

## 2019-07-10 DIAGNOSIS — E440 Moderate protein-calorie malnutrition: Secondary | ICD-10-CM | POA: Diagnosis not present

## 2019-07-10 DIAGNOSIS — N183 Chronic kidney disease, stage 3 unspecified: Secondary | ICD-10-CM | POA: Diagnosis not present

## 2019-07-10 DIAGNOSIS — I10 Essential (primary) hypertension: Secondary | ICD-10-CM | POA: Diagnosis not present

## 2019-07-10 DIAGNOSIS — K580 Irritable bowel syndrome with diarrhea: Secondary | ICD-10-CM | POA: Diagnosis not present

## 2019-07-10 DIAGNOSIS — F411 Generalized anxiety disorder: Secondary | ICD-10-CM | POA: Diagnosis not present

## 2019-07-10 DIAGNOSIS — R300 Dysuria: Secondary | ICD-10-CM | POA: Diagnosis not present

## 2019-07-10 DIAGNOSIS — R4582 Worries: Secondary | ICD-10-CM | POA: Diagnosis not present

## 2019-07-10 DIAGNOSIS — E039 Hypothyroidism, unspecified: Secondary | ICD-10-CM | POA: Diagnosis not present

## 2019-07-10 DIAGNOSIS — J301 Allergic rhinitis due to pollen: Secondary | ICD-10-CM | POA: Diagnosis not present

## 2019-07-10 DIAGNOSIS — Z1212 Encounter for screening for malignant neoplasm of rectum: Secondary | ICD-10-CM | POA: Diagnosis not present

## 2019-07-10 DIAGNOSIS — F331 Major depressive disorder, recurrent, moderate: Secondary | ICD-10-CM | POA: Diagnosis not present

## 2019-07-10 DIAGNOSIS — K219 Gastro-esophageal reflux disease without esophagitis: Secondary | ICD-10-CM | POA: Diagnosis not present

## 2019-07-10 DIAGNOSIS — F332 Major depressive disorder, recurrent severe without psychotic features: Secondary | ICD-10-CM | POA: Diagnosis not present

## 2019-07-11 DIAGNOSIS — H6123 Impacted cerumen, bilateral: Secondary | ICD-10-CM | POA: Diagnosis not present

## 2019-07-15 ENCOUNTER — Telehealth: Payer: Self-pay | Admitting: Gastroenterology

## 2019-07-15 MED ORDER — BUDESONIDE 3 MG PO CPEP: 3.0000 mg | ORAL_CAPSULE | Freq: Every day | ORAL | 1 refills | Status: DC

## 2019-07-15 NOTE — Telephone Encounter (Signed)
Refilled as requested  

## 2019-07-17 DIAGNOSIS — E7849 Other hyperlipidemia: Secondary | ICD-10-CM | POA: Diagnosis not present

## 2019-07-17 DIAGNOSIS — I10 Essential (primary) hypertension: Secondary | ICD-10-CM | POA: Diagnosis not present

## 2019-08-08 DIAGNOSIS — M79676 Pain in unspecified toe(s): Secondary | ICD-10-CM | POA: Diagnosis not present

## 2019-08-08 DIAGNOSIS — E1142 Type 2 diabetes mellitus with diabetic polyneuropathy: Secondary | ICD-10-CM | POA: Diagnosis not present

## 2019-08-08 DIAGNOSIS — B351 Tinea unguium: Secondary | ICD-10-CM | POA: Diagnosis not present

## 2019-08-08 DIAGNOSIS — L84 Corns and callosities: Secondary | ICD-10-CM | POA: Diagnosis not present

## 2019-08-16 DIAGNOSIS — K219 Gastro-esophageal reflux disease without esophagitis: Secondary | ICD-10-CM | POA: Diagnosis not present

## 2019-08-16 DIAGNOSIS — I10 Essential (primary) hypertension: Secondary | ICD-10-CM | POA: Diagnosis not present

## 2019-08-16 DIAGNOSIS — E1122 Type 2 diabetes mellitus with diabetic chronic kidney disease: Secondary | ICD-10-CM | POA: Diagnosis not present

## 2019-08-22 DIAGNOSIS — J329 Chronic sinusitis, unspecified: Secondary | ICD-10-CM | POA: Diagnosis not present

## 2019-09-03 DIAGNOSIS — C44629 Squamous cell carcinoma of skin of left upper limb, including shoulder: Secondary | ICD-10-CM | POA: Diagnosis not present

## 2019-09-12 DIAGNOSIS — H6123 Impacted cerumen, bilateral: Secondary | ICD-10-CM | POA: Diagnosis not present

## 2019-09-13 DIAGNOSIS — Z6820 Body mass index (BMI) 20.0-20.9, adult: Secondary | ICD-10-CM | POA: Diagnosis not present

## 2019-09-13 DIAGNOSIS — D649 Anemia, unspecified: Secondary | ICD-10-CM | POA: Diagnosis not present

## 2019-09-13 DIAGNOSIS — R531 Weakness: Secondary | ICD-10-CM | POA: Diagnosis not present

## 2019-09-13 DIAGNOSIS — D510 Vitamin B12 deficiency anemia due to intrinsic factor deficiency: Secondary | ICD-10-CM | POA: Diagnosis not present

## 2019-09-13 DIAGNOSIS — R5383 Other fatigue: Secondary | ICD-10-CM | POA: Diagnosis not present

## 2019-09-13 DIAGNOSIS — D529 Folate deficiency anemia, unspecified: Secondary | ICD-10-CM | POA: Diagnosis not present

## 2019-10-03 DIAGNOSIS — E039 Hypothyroidism, unspecified: Secondary | ICD-10-CM | POA: Diagnosis not present

## 2019-10-03 DIAGNOSIS — R5383 Other fatigue: Secondary | ICD-10-CM | POA: Diagnosis not present

## 2019-10-03 DIAGNOSIS — E1122 Type 2 diabetes mellitus with diabetic chronic kidney disease: Secondary | ICD-10-CM | POA: Diagnosis not present

## 2019-10-03 DIAGNOSIS — N183 Chronic kidney disease, stage 3 unspecified: Secondary | ICD-10-CM | POA: Diagnosis not present

## 2019-10-03 DIAGNOSIS — E782 Mixed hyperlipidemia: Secondary | ICD-10-CM | POA: Diagnosis not present

## 2019-10-03 DIAGNOSIS — K2100 Gastro-esophageal reflux disease with esophagitis, without bleeding: Secondary | ICD-10-CM | POA: Diagnosis not present

## 2019-10-03 DIAGNOSIS — I10 Essential (primary) hypertension: Secondary | ICD-10-CM | POA: Diagnosis not present

## 2019-10-07 DIAGNOSIS — E039 Hypothyroidism, unspecified: Secondary | ICD-10-CM | POA: Diagnosis not present

## 2019-10-07 DIAGNOSIS — E1122 Type 2 diabetes mellitus with diabetic chronic kidney disease: Secondary | ICD-10-CM | POA: Diagnosis not present

## 2019-10-07 DIAGNOSIS — E1142 Type 2 diabetes mellitus with diabetic polyneuropathy: Secondary | ICD-10-CM | POA: Diagnosis not present

## 2019-10-07 DIAGNOSIS — I10 Essential (primary) hypertension: Secondary | ICD-10-CM | POA: Diagnosis not present

## 2019-10-07 DIAGNOSIS — F332 Major depressive disorder, recurrent severe without psychotic features: Secondary | ICD-10-CM | POA: Diagnosis not present

## 2019-10-07 DIAGNOSIS — K580 Irritable bowel syndrome with diarrhea: Secondary | ICD-10-CM | POA: Diagnosis not present

## 2019-10-07 DIAGNOSIS — R4582 Worries: Secondary | ICD-10-CM | POA: Diagnosis not present

## 2019-10-07 DIAGNOSIS — E782 Mixed hyperlipidemia: Secondary | ICD-10-CM | POA: Diagnosis not present

## 2019-10-09 DIAGNOSIS — C44629 Squamous cell carcinoma of skin of left upper limb, including shoulder: Secondary | ICD-10-CM | POA: Diagnosis not present

## 2019-10-10 DIAGNOSIS — M79676 Pain in unspecified toe(s): Secondary | ICD-10-CM | POA: Diagnosis not present

## 2019-10-10 DIAGNOSIS — B351 Tinea unguium: Secondary | ICD-10-CM | POA: Diagnosis not present

## 2019-10-10 DIAGNOSIS — E1142 Type 2 diabetes mellitus with diabetic polyneuropathy: Secondary | ICD-10-CM | POA: Diagnosis not present

## 2019-10-10 DIAGNOSIS — L84 Corns and callosities: Secondary | ICD-10-CM | POA: Diagnosis not present

## 2019-10-17 DIAGNOSIS — Z6820 Body mass index (BMI) 20.0-20.9, adult: Secondary | ICD-10-CM | POA: Diagnosis not present

## 2019-10-17 DIAGNOSIS — F331 Major depressive disorder, recurrent, moderate: Secondary | ICD-10-CM | POA: Diagnosis not present

## 2019-11-18 ENCOUNTER — Telehealth: Payer: Self-pay | Admitting: Gastroenterology

## 2019-11-18 MED ORDER — BUDESONIDE 3 MG PO CPEP: 3.0000 mg | ORAL_CAPSULE | Freq: Every day | ORAL | 1 refills | Status: DC

## 2019-11-18 NOTE — Telephone Encounter (Signed)
Requesting refill on Entocort

## 2019-11-18 NOTE — Telephone Encounter (Signed)
Refilled as patient requested. Pt will need a yearly follow up with next refill request

## 2019-11-28 DIAGNOSIS — H6123 Impacted cerumen, bilateral: Secondary | ICD-10-CM | POA: Diagnosis not present

## 2019-11-28 DIAGNOSIS — H9201 Otalgia, right ear: Secondary | ICD-10-CM | POA: Diagnosis not present

## 2019-12-12 DIAGNOSIS — R270 Ataxia, unspecified: Secondary | ICD-10-CM | POA: Diagnosis not present

## 2019-12-12 DIAGNOSIS — D692 Other nonthrombocytopenic purpura: Secondary | ICD-10-CM | POA: Diagnosis not present

## 2019-12-12 DIAGNOSIS — Z6820 Body mass index (BMI) 20.0-20.9, adult: Secondary | ICD-10-CM | POA: Diagnosis not present

## 2019-12-13 ENCOUNTER — Other Ambulatory Visit: Payer: Self-pay | Admitting: Family Medicine

## 2019-12-13 DIAGNOSIS — R270 Ataxia, unspecified: Secondary | ICD-10-CM

## 2019-12-16 DIAGNOSIS — H9209 Otalgia, unspecified ear: Secondary | ICD-10-CM | POA: Diagnosis not present

## 2019-12-22 ENCOUNTER — Ambulatory Visit
Admission: RE | Admit: 2019-12-22 | Discharge: 2019-12-22 | Disposition: A | Discharge status: Home / Self Care | Payer: Medicare Other | Source: Ambulatory Visit | Attending: Family Medicine | Admitting: Family Medicine

## 2019-12-22 ENCOUNTER — Other Ambulatory Visit: Payer: Self-pay

## 2019-12-22 DIAGNOSIS — I6782 Cerebral ischemia: Secondary | ICD-10-CM | POA: Diagnosis not present

## 2019-12-22 DIAGNOSIS — R270 Ataxia, unspecified: Secondary | ICD-10-CM | POA: Diagnosis not present

## 2019-12-22 DIAGNOSIS — I6389 Other cerebral infarction: Secondary | ICD-10-CM | POA: Diagnosis not present

## 2019-12-22 DIAGNOSIS — G319 Degenerative disease of nervous system, unspecified: Secondary | ICD-10-CM | POA: Diagnosis not present

## 2019-12-25 DIAGNOSIS — R270 Ataxia, unspecified: Secondary | ICD-10-CM | POA: Diagnosis not present

## 2019-12-25 DIAGNOSIS — R296 Repeated falls: Secondary | ICD-10-CM | POA: Diagnosis not present

## 2019-12-26 DIAGNOSIS — E1142 Type 2 diabetes mellitus with diabetic polyneuropathy: Secondary | ICD-10-CM | POA: Diagnosis not present

## 2019-12-26 DIAGNOSIS — L84 Corns and callosities: Secondary | ICD-10-CM | POA: Diagnosis not present

## 2019-12-26 DIAGNOSIS — M79676 Pain in unspecified toe(s): Secondary | ICD-10-CM | POA: Diagnosis not present

## 2019-12-26 DIAGNOSIS — B351 Tinea unguium: Secondary | ICD-10-CM | POA: Diagnosis not present

## 2020-01-02 DIAGNOSIS — R5383 Other fatigue: Secondary | ICD-10-CM | POA: Diagnosis not present

## 2020-01-02 DIAGNOSIS — E1122 Type 2 diabetes mellitus with diabetic chronic kidney disease: Secondary | ICD-10-CM | POA: Diagnosis not present

## 2020-01-02 DIAGNOSIS — E782 Mixed hyperlipidemia: Secondary | ICD-10-CM | POA: Diagnosis not present

## 2020-01-02 DIAGNOSIS — E1142 Type 2 diabetes mellitus with diabetic polyneuropathy: Secondary | ICD-10-CM | POA: Diagnosis not present

## 2020-01-02 DIAGNOSIS — E039 Hypothyroidism, unspecified: Secondary | ICD-10-CM | POA: Diagnosis not present

## 2020-01-02 DIAGNOSIS — N183 Chronic kidney disease, stage 3 unspecified: Secondary | ICD-10-CM | POA: Diagnosis not present

## 2020-01-02 DIAGNOSIS — K2100 Gastro-esophageal reflux disease with esophagitis, without bleeding: Secondary | ICD-10-CM | POA: Diagnosis not present

## 2020-01-02 DIAGNOSIS — E1165 Type 2 diabetes mellitus with hyperglycemia: Secondary | ICD-10-CM | POA: Diagnosis not present

## 2020-01-02 DIAGNOSIS — I10 Essential (primary) hypertension: Secondary | ICD-10-CM | POA: Diagnosis not present

## 2020-01-06 DIAGNOSIS — I10 Essential (primary) hypertension: Secondary | ICD-10-CM | POA: Diagnosis not present

## 2020-01-06 DIAGNOSIS — K580 Irritable bowel syndrome with diarrhea: Secondary | ICD-10-CM | POA: Diagnosis not present

## 2020-01-06 DIAGNOSIS — E782 Mixed hyperlipidemia: Secondary | ICD-10-CM | POA: Diagnosis not present

## 2020-01-06 DIAGNOSIS — D692 Other nonthrombocytopenic purpura: Secondary | ICD-10-CM | POA: Diagnosis not present

## 2020-01-06 DIAGNOSIS — R270 Ataxia, unspecified: Secondary | ICD-10-CM | POA: Diagnosis not present

## 2020-01-06 DIAGNOSIS — Z6821 Body mass index (BMI) 21.0-21.9, adult: Secondary | ICD-10-CM | POA: Diagnosis not present

## 2020-01-06 DIAGNOSIS — Z23 Encounter for immunization: Secondary | ICD-10-CM | POA: Diagnosis not present

## 2020-01-06 DIAGNOSIS — E1122 Type 2 diabetes mellitus with diabetic chronic kidney disease: Secondary | ICD-10-CM | POA: Diagnosis not present

## 2020-01-21 ENCOUNTER — Encounter: Payer: Self-pay | Admitting: Gastroenterology

## 2020-01-21 ENCOUNTER — Ambulatory Visit (INDEPENDENT_AMBULATORY_CARE_PROVIDER_SITE_OTHER): Payer: Medicare Other | Admitting: Gastroenterology

## 2020-01-21 VITALS — BP 126/78 | HR 92 | Ht 60.0 in | Wt 114.2 lb

## 2020-01-21 DIAGNOSIS — K52832 Lymphocytic colitis: Secondary | ICD-10-CM

## 2020-01-21 DIAGNOSIS — K580 Irritable bowel syndrome with diarrhea: Secondary | ICD-10-CM | POA: Diagnosis not present

## 2020-01-21 DIAGNOSIS — R1314 Dysphagia, pharyngoesophageal phase: Secondary | ICD-10-CM

## 2020-01-21 MED ORDER — BUDESONIDE 3 MG PO CPEP: 3.0000 mg | ORAL_CAPSULE | Freq: Every day | ORAL | 3 refills | Status: DC

## 2020-01-21 NOTE — Progress Notes (Signed)
[facility] Progress Note  Chief Complaint: Lymphocytic colitis  Subjective  History: Ellen Cruz was last seen for an upper endoscopy late November 2020 entheses which was normal).  She has years of pharyngeal esophageal dysphagia and my impression was UES dysmotility.  Cerebrovascular disease (see MRI results below)  She has microscopic colitis under good control on low-dose budesonide.  She does well on just 3 mg a day of budesonide, and more than that makes her constipated.  Hadlea is still bothered by the same dysphagia, with mostly solid food feeling hung up in the neck and she might after bring it back up.  This makes her very anxious and says her husband has to pat her on the back to relieve the symptoms.  She can swallow water well and I taught her how to use a chin tuck maneuver.  She is having episodes of vertigo and balance issues.   ROS: Cardiovascular:  no chest pain Respiratory: no dyspnea Anxiety Vertigo   The patient's Past Medical, Family and Social History were reviewed and are on file in the EMR.  Objective:  Med list reviewed  Current Outpatient Medications:  .  aspirin 81 MG EC tablet, Take 81 mg by mouth daily. Swallow whole., Disp: , Rfl:  .  budesonide (ENTOCORT EC) 3 MG 24 hr capsule, Take 1 capsule (3 mg total) by mouth daily., Disp: 90 capsule, Rfl: 1 .  Cholecalciferol (VITAMIN D3) 25 MCG (1000 UT) CAPS, Take 1 capsule by mouth daily., Disp: , Rfl:  .  diazepam (VALIUM) 10 MG tablet, Take 10 mg by mouth 3 (three) times daily as needed for anxiety., Disp: , Rfl:  .  Fexofenadine-Pseudoephedrine (ALLEGRA-D 24 HOUR PO), Take 1 tablet by mouth daily., Disp: , Rfl:  .  furosemide (LASIX) 40 MG tablet, Take 40 mg by mouth daily., Disp: , Rfl:  .  hydroxypropyl methylcellulose / hypromellose (ISOPTO TEARS / GONIOVISC) 2.5 % ophthalmic solution, Place 1 drop into both eyes as needed for dry eyes., Disp: , Rfl:  .  levothyroxine (SYNTHROID, LEVOTHROID)  75 MCG tablet, Take 75 mcg by mouth daily., Disp: , Rfl:  .  meclizine (ANTIVERT) 12.5 MG tablet, Take 12.5 mg by mouth 3 (three) times daily as needed for dizziness., Disp: , Rfl:  .  Rosuvastatin Calcium 20 MG CPSP, Take 1 tablet by mouth once a week., Disp: , Rfl:  .  budesonide (ENTOCORT EC) 3 MG 24 hr capsule, Take 1 capsule (3 mg total) by mouth daily., Disp: 30 capsule, Rfl: 3   Vital signs in last 24 hrs: Vitals:   01/21/20 1524  BP: 126/78  Pulse: 92    Physical Exam   HEENT: sclera anicteric, oral mucosa moist without lesions.  Normal vocal quality  Neck: supple, no thyromegaly, JVD or lymphadenopathy  Cardiac: RRR without murmurs, S1S2 heard, no peripheral edema  Pulm: clear to auscultation bilaterally, normal RR and effort noted  Abdomen: soft, no tenderness, with active bowel sounds. No guarding or palpable hepatosplenomegaly.  Skin; warm and dry, no jaundice or rash.  Multiple areas of easy bruising that she attributes to starting aspirin after the MRI was done  Labs:   ___________________________________________ Radiologic studies:  CLINICAL DATA:  Ataxia.   EXAM: MRI HEAD WITHOUT CONTRAST   TECHNIQUE: Multiplanar, multiecho pulse sequences of the brain and surrounding structures were obtained without intravenous contrast.   COMPARISON:  None.   FINDINGS: Brain: A 3 mm focus of mild trace diffusion weighted signal hyperintensity  in the right superior frontal gyrus is without reduced ADC. No definite acute infarct, mass, midline shift, or extra-axial fluid collection is identified. There are chronic infarcts in the right basal ganglia with associated chronic blood products and ex vacuo dilatation of the right frontal horn. T2 hyperintensities in the cerebral white matter bilaterally are nonspecific but compatible with mild chronic small vessel ischemic disease. There is a small chronic infarct in the inferior aspect of the right cerebellar  hemisphere. There is mild-to-moderate cerebral atrophy.   Vascular: Major intracranial vascular flow voids are preserved.   Skull and upper cervical spine: Unremarkable bone marrow signal.   Sinuses/Orbits: Bilateral cataract extraction. Paranasal sinuses and mastoid air cells are clear.   Other: None.   IMPRESSION: 1. No definite acute intracranial abnormality. 2. Mild chronic small vessel ischemic disease with chronic infarcts in the right basal ganglia and right cerebellum.     Electronically Signed   By: Logan Bores M.D.   On: 12/22/2019 22:47  ____________________________________________ Other:   _____________________________________________ Assessment & Plan  Assessment: Encounter Diagnoses  Name Primary?  . Lymphocytic colitis Yes  . Irritable bowel syndrome with diarrhea   . Pharyngoesophageal dysphagia    Her microscopic colitis is under good control.  I think she also has some overlying IBS and food intolerances based on history. Low-dose budesonide continued and new prescription sent.  Her dysphagia remains the same.  I suspect she has cricopharyngeal dysfunction which I unfortunately do not have an endoscopic or medical solution for her.  She was again advised to have liquid protein calorie supplements to help maintain adequate nutrition and weight.  Her overall functional status has been slowly declining and she has chronic white matter disease based on the MRI.  Plan: Follow-up in a year or sooner as needed.   20 minutes were spent on this encounter (including chart review, history/exam, counseling/coordination of care, and documentation)  [name]

## 2020-01-21 NOTE — Patient Instructions (Addendum)
If you are age 80 or older, your body mass index should be between 23-30. Your Body mass index is 22.31 kg/m. If this is out of the aforementioned range listed, please consider follow up with your Primary Care Provider.  If you are age 8 or younger, your body mass index should be between 19-25. Your Body mass index is 22.31 kg/m. If this is out of the aformentioned range listed, please consider follow up with your Primary Care Provider.   It was a pleasure to see you today!  Dr. Loletha Carrow

## 2020-01-23 DIAGNOSIS — H6123 Impacted cerumen, bilateral: Secondary | ICD-10-CM | POA: Diagnosis not present

## 2020-02-10 ENCOUNTER — Telehealth: Payer: Self-pay | Admitting: Gastroenterology

## 2020-02-10 ENCOUNTER — Other Ambulatory Visit: Payer: Self-pay

## 2020-02-10 MED ORDER — BUDESONIDE 3 MG PO CPEP: 3.0000 mg | ORAL_CAPSULE | Freq: Every day | ORAL | 1 refills | Status: DC

## 2020-02-10 NOTE — Telephone Encounter (Signed)
This has been done as requested from the patient

## 2020-02-10 NOTE — Telephone Encounter (Signed)
Pt is requesting a change on her prescription budesonide, pt is requesting a 90 day supply instead of 30.

## 2020-03-03 DIAGNOSIS — Z23 Encounter for immunization: Secondary | ICD-10-CM | POA: Diagnosis not present

## 2020-03-05 DIAGNOSIS — E1142 Type 2 diabetes mellitus with diabetic polyneuropathy: Secondary | ICD-10-CM | POA: Diagnosis not present

## 2020-03-05 DIAGNOSIS — M79676 Pain in unspecified toe(s): Secondary | ICD-10-CM | POA: Diagnosis not present

## 2020-03-05 DIAGNOSIS — B351 Tinea unguium: Secondary | ICD-10-CM | POA: Diagnosis not present

## 2020-03-05 DIAGNOSIS — L84 Corns and callosities: Secondary | ICD-10-CM | POA: Diagnosis not present

## 2020-03-25 ENCOUNTER — Telehealth: Payer: Self-pay | Admitting: Gastroenterology

## 2020-03-25 ENCOUNTER — Other Ambulatory Visit: Payer: Self-pay

## 2020-03-25 MED ORDER — BUDESONIDE 3 MG PO CPEP: 3.0000 mg | ORAL_CAPSULE | Freq: Every day | ORAL | 1 refills | Status: DC

## 2020-03-25 NOTE — Telephone Encounter (Signed)
This has been refilled as requested

## 2020-03-25 NOTE — Telephone Encounter (Signed)
Pt is requesting a refill on her budesonide.

## 2020-03-26 DIAGNOSIS — H6123 Impacted cerumen, bilateral: Secondary | ICD-10-CM | POA: Diagnosis not present

## 2020-04-06 DIAGNOSIS — E1165 Type 2 diabetes mellitus with hyperglycemia: Secondary | ICD-10-CM | POA: Diagnosis not present

## 2020-04-06 DIAGNOSIS — I10 Essential (primary) hypertension: Secondary | ICD-10-CM | POA: Diagnosis not present

## 2020-04-06 DIAGNOSIS — K2100 Gastro-esophageal reflux disease with esophagitis, without bleeding: Secondary | ICD-10-CM | POA: Diagnosis not present

## 2020-04-06 DIAGNOSIS — E1142 Type 2 diabetes mellitus with diabetic polyneuropathy: Secondary | ICD-10-CM | POA: Diagnosis not present

## 2020-04-06 DIAGNOSIS — F418 Other specified anxiety disorders: Secondary | ICD-10-CM | POA: Diagnosis not present

## 2020-04-06 DIAGNOSIS — N1831 Chronic kidney disease, stage 3a: Secondary | ICD-10-CM | POA: Diagnosis not present

## 2020-04-06 DIAGNOSIS — E782 Mixed hyperlipidemia: Secondary | ICD-10-CM | POA: Diagnosis not present

## 2020-04-06 DIAGNOSIS — E039 Hypothyroidism, unspecified: Secondary | ICD-10-CM | POA: Diagnosis not present

## 2020-04-08 DIAGNOSIS — E1122 Type 2 diabetes mellitus with diabetic chronic kidney disease: Secondary | ICD-10-CM | POA: Diagnosis not present

## 2020-04-08 DIAGNOSIS — E039 Hypothyroidism, unspecified: Secondary | ICD-10-CM | POA: Diagnosis not present

## 2020-04-08 DIAGNOSIS — F332 Major depressive disorder, recurrent severe without psychotic features: Secondary | ICD-10-CM | POA: Diagnosis not present

## 2020-04-08 DIAGNOSIS — R4582 Worries: Secondary | ICD-10-CM | POA: Diagnosis not present

## 2020-04-08 DIAGNOSIS — E782 Mixed hyperlipidemia: Secondary | ICD-10-CM | POA: Diagnosis not present

## 2020-04-08 DIAGNOSIS — I10 Essential (primary) hypertension: Secondary | ICD-10-CM | POA: Diagnosis not present

## 2020-04-08 DIAGNOSIS — F411 Generalized anxiety disorder: Secondary | ICD-10-CM | POA: Diagnosis not present

## 2020-04-08 DIAGNOSIS — E1142 Type 2 diabetes mellitus with diabetic polyneuropathy: Secondary | ICD-10-CM | POA: Diagnosis not present

## 2020-04-27 ENCOUNTER — Telehealth: Payer: Self-pay | Admitting: Gastroenterology

## 2020-04-27 NOTE — Telephone Encounter (Signed)
Patient needs to refill budisonide 3 mg with 3 refills. Please send it to [facility].

## 2020-04-28 ENCOUNTER — Other Ambulatory Visit: Payer: Self-pay

## 2020-04-28 MED ORDER — BUDESONIDE 3 MG PO CPEP: 3.0000 mg | ORAL_CAPSULE | Freq: Every day | ORAL | 1 refills | Status: DC

## 2020-05-05 DIAGNOSIS — C4442 Squamous cell carcinoma of skin of scalp and neck: Secondary | ICD-10-CM | POA: Diagnosis not present

## 2020-05-05 DIAGNOSIS — D485 Neoplasm of uncertain behavior of skin: Secondary | ICD-10-CM | POA: Diagnosis not present

## 2020-05-06 DIAGNOSIS — R238 Other skin changes: Secondary | ICD-10-CM | POA: Diagnosis not present

## 2020-05-08 DIAGNOSIS — Z20828 Contact with and (suspected) exposure to other viral communicable diseases: Secondary | ICD-10-CM | POA: Diagnosis not present

## 2020-05-08 DIAGNOSIS — J029 Acute pharyngitis, unspecified: Secondary | ICD-10-CM | POA: Diagnosis not present

## 2020-05-19 DIAGNOSIS — M79676 Pain in unspecified toe(s): Secondary | ICD-10-CM | POA: Diagnosis not present

## 2020-05-19 DIAGNOSIS — L84 Corns and callosities: Secondary | ICD-10-CM | POA: Diagnosis not present

## 2020-05-19 DIAGNOSIS — B351 Tinea unguium: Secondary | ICD-10-CM | POA: Diagnosis not present

## 2020-05-19 DIAGNOSIS — E1142 Type 2 diabetes mellitus with diabetic polyneuropathy: Secondary | ICD-10-CM | POA: Diagnosis not present

## 2020-05-28 DIAGNOSIS — H6123 Impacted cerumen, bilateral: Secondary | ICD-10-CM | POA: Diagnosis not present

## 2020-07-16 DIAGNOSIS — R42 Dizziness and giddiness: Secondary | ICD-10-CM | POA: Diagnosis not present

## 2020-07-16 DIAGNOSIS — S161XXA Strain of muscle, fascia and tendon at neck level, initial encounter: Secondary | ICD-10-CM | POA: Diagnosis not present

## 2020-07-16 DIAGNOSIS — Z6821 Body mass index (BMI) 21.0-21.9, adult: Secondary | ICD-10-CM | POA: Diagnosis not present

## 2020-07-27 DIAGNOSIS — K2100 Gastro-esophageal reflux disease with esophagitis, without bleeding: Secondary | ICD-10-CM | POA: Diagnosis not present

## 2020-07-27 DIAGNOSIS — R5383 Other fatigue: Secondary | ICD-10-CM | POA: Diagnosis not present

## 2020-07-27 DIAGNOSIS — E1165 Type 2 diabetes mellitus with hyperglycemia: Secondary | ICD-10-CM | POA: Diagnosis not present

## 2020-07-27 DIAGNOSIS — E1149 Type 2 diabetes mellitus with other diabetic neurological complication: Secondary | ICD-10-CM | POA: Diagnosis not present

## 2020-07-27 DIAGNOSIS — E1122 Type 2 diabetes mellitus with diabetic chronic kidney disease: Secondary | ICD-10-CM | POA: Diagnosis not present

## 2020-07-27 DIAGNOSIS — E782 Mixed hyperlipidemia: Secondary | ICD-10-CM | POA: Diagnosis not present

## 2020-07-27 DIAGNOSIS — E039 Hypothyroidism, unspecified: Secondary | ICD-10-CM | POA: Diagnosis not present

## 2020-07-27 DIAGNOSIS — E7849 Other hyperlipidemia: Secondary | ICD-10-CM | POA: Diagnosis not present

## 2020-07-27 DIAGNOSIS — N183 Chronic kidney disease, stage 3 unspecified: Secondary | ICD-10-CM | POA: Diagnosis not present

## 2020-08-01 DIAGNOSIS — E1142 Type 2 diabetes mellitus with diabetic polyneuropathy: Secondary | ICD-10-CM | POA: Diagnosis not present

## 2020-08-01 DIAGNOSIS — E7849 Other hyperlipidemia: Secondary | ICD-10-CM | POA: Diagnosis not present

## 2020-08-01 DIAGNOSIS — R4582 Worries: Secondary | ICD-10-CM | POA: Diagnosis not present

## 2020-08-01 DIAGNOSIS — E1122 Type 2 diabetes mellitus with diabetic chronic kidney disease: Secondary | ICD-10-CM | POA: Diagnosis not present

## 2020-08-01 DIAGNOSIS — E039 Hypothyroidism, unspecified: Secondary | ICD-10-CM | POA: Diagnosis not present

## 2020-08-01 DIAGNOSIS — D692 Other nonthrombocytopenic purpura: Secondary | ICD-10-CM | POA: Diagnosis not present

## 2020-08-01 DIAGNOSIS — I10 Essential (primary) hypertension: Secondary | ICD-10-CM | POA: Diagnosis not present

## 2020-08-01 DIAGNOSIS — Z7189 Other specified counseling: Secondary | ICD-10-CM | POA: Diagnosis not present

## 2020-08-03 DIAGNOSIS — D519 Vitamin B12 deficiency anemia, unspecified: Secondary | ICD-10-CM | POA: Diagnosis not present

## 2020-08-03 DIAGNOSIS — E109 Type 1 diabetes mellitus without complications: Secondary | ICD-10-CM | POA: Diagnosis not present

## 2020-08-03 DIAGNOSIS — E7849 Other hyperlipidemia: Secondary | ICD-10-CM | POA: Diagnosis not present

## 2020-08-03 DIAGNOSIS — K2100 Gastro-esophageal reflux disease with esophagitis, without bleeding: Secondary | ICD-10-CM | POA: Diagnosis not present

## 2020-08-03 DIAGNOSIS — N183 Chronic kidney disease, stage 3 unspecified: Secondary | ICD-10-CM | POA: Diagnosis not present

## 2020-08-03 DIAGNOSIS — Z0001 Encounter for general adult medical examination with abnormal findings: Secondary | ICD-10-CM | POA: Diagnosis not present

## 2020-08-03 DIAGNOSIS — F418 Other specified anxiety disorders: Secondary | ICD-10-CM | POA: Diagnosis not present

## 2020-08-03 DIAGNOSIS — I10 Essential (primary) hypertension: Secondary | ICD-10-CM | POA: Diagnosis not present

## 2020-08-05 DIAGNOSIS — H6123 Impacted cerumen, bilateral: Secondary | ICD-10-CM | POA: Diagnosis not present

## 2020-08-06 DIAGNOSIS — B351 Tinea unguium: Secondary | ICD-10-CM | POA: Diagnosis not present

## 2020-08-06 DIAGNOSIS — E1142 Type 2 diabetes mellitus with diabetic polyneuropathy: Secondary | ICD-10-CM | POA: Diagnosis not present

## 2020-08-06 DIAGNOSIS — M79676 Pain in unspecified toe(s): Secondary | ICD-10-CM | POA: Diagnosis not present

## 2020-08-06 DIAGNOSIS — L84 Corns and callosities: Secondary | ICD-10-CM | POA: Diagnosis not present

## 2020-08-13 DIAGNOSIS — Z23 Encounter for immunization: Secondary | ICD-10-CM | POA: Diagnosis not present

## 2020-08-29 DIAGNOSIS — L820 Inflamed seborrheic keratosis: Secondary | ICD-10-CM | POA: Diagnosis not present

## 2020-08-29 DIAGNOSIS — Z6821 Body mass index (BMI) 21.0-21.9, adult: Secondary | ICD-10-CM | POA: Diagnosis not present

## 2020-08-29 DIAGNOSIS — R270 Ataxia, unspecified: Secondary | ICD-10-CM | POA: Diagnosis not present

## 2020-09-08 DIAGNOSIS — Z6821 Body mass index (BMI) 21.0-21.9, adult: Secondary | ICD-10-CM | POA: Diagnosis not present

## 2020-09-08 DIAGNOSIS — R296 Repeated falls: Secondary | ICD-10-CM | POA: Diagnosis not present

## 2020-09-08 DIAGNOSIS — L03116 Cellulitis of left lower limb: Secondary | ICD-10-CM | POA: Diagnosis not present

## 2020-09-17 DIAGNOSIS — L03116 Cellulitis of left lower limb: Secondary | ICD-10-CM | POA: Diagnosis not present

## 2020-09-17 DIAGNOSIS — Z6821 Body mass index (BMI) 21.0-21.9, adult: Secondary | ICD-10-CM | POA: Diagnosis not present

## 2020-09-17 DIAGNOSIS — R296 Repeated falls: Secondary | ICD-10-CM | POA: Diagnosis not present

## 2020-09-23 DIAGNOSIS — M545 Low back pain, unspecified: Secondary | ICD-10-CM | POA: Diagnosis not present

## 2020-09-23 DIAGNOSIS — R270 Ataxia, unspecified: Secondary | ICD-10-CM | POA: Diagnosis not present

## 2020-09-23 DIAGNOSIS — Z9181 History of falling: Secondary | ICD-10-CM | POA: Diagnosis not present

## 2020-09-24 ENCOUNTER — Ambulatory Visit: Payer: Medicare Other | Admitting: Gastroenterology

## 2020-09-25 ENCOUNTER — Ambulatory Visit (INDEPENDENT_AMBULATORY_CARE_PROVIDER_SITE_OTHER): Payer: Medicare Other | Admitting: Gastroenterology

## 2020-09-25 ENCOUNTER — Encounter: Payer: Self-pay | Admitting: Gastroenterology

## 2020-09-25 VITALS — BP 120/72 | HR 73 | Ht 60.5 in | Wt 112.0 lb

## 2020-09-25 DIAGNOSIS — R1314 Dysphagia, pharyngoesophageal phase: Secondary | ICD-10-CM

## 2020-09-25 NOTE — Progress Notes (Signed)
Dysphagia         [facility] Progress Note  Chief Complaint: Dysphagia  Subjective  History: Ellen Cruz was last seen October 2021 for microscopic colitis controlled on low-dose budesonide.  She also has longstanding pharyngeal esophageal dysphagia with normal prior upper endoscopy.  My clinical suspicion has been UES dysfunction.  Ellen Cruz describes ongoing difficulty swallowing solids and liquids, they may feel stuck in the throat she has to bring them back up, or her husband needs to slap her on the back to help relieve it.  Unfortunately, her neurologic function has declined, she reports another stroke within the last couple of months. 09/23/2020 physical medicine rehab office note indicates patient's difficulty with balance and ambulation and history of several CVAs, "most recent last month".  Last EGD Nov 2020 (see report)  Ellen Cruz says her diarrhea is under good control as long she takes her medicines and is cautious about what she eats. She tells me that her PCP sent her back for an upper endoscopy to stretch her esophagus. She has the same problems with both solid and liquid dysphagia, tries to remember chin tuck when she can.  She feels that her mobility is slowly declining and her balance is difficult as well.  She and I had same discussion we have had several times in the past.  I explained to her previous endoscopy findings, lack of stricture and how I cannot make her swallowing better with endoscopic dilation since there is no fixed stricture.  I tried to explain the concept of pharyngeal esophageal muscular dysfunction, which she sometimes seems to understand but then forgets.  She kept asking why I could not just stretch it.  I believe this also has to do with her repeated cerebrovascular events, and I recommended she have a speech-language pathology evaluation/modified barium study.  ROS: Cardiovascular:  no chest pain Respiratory: no dyspnea  The patient's Past Medical, Family and  Social History were reviewed and are on file in the EMR.  Objective:  Med list reviewed  Current Outpatient Medications:    aspirin 81 MG EC tablet, Take 81 mg by mouth daily. Swallow whole., Disp: , Rfl:    budesonide (ENTOCORT EC) 3 MG 24 hr capsule, Take 1 capsule (3 mg total) by mouth daily., Disp: 90 capsule, Rfl: 1   Cholecalciferol (VITAMIN D3) 25 MCG (1000 UT) CAPS, Take 1 capsule by mouth daily., Disp: , Rfl:    diazepam (VALIUM) 10 MG tablet, Take 10 mg by mouth 3 (three) times daily as needed for anxiety., Disp: , Rfl:    Fexofenadine-Pseudoephedrine (ALLEGRA-D 24 HOUR PO), Take 1 tablet by mouth daily., Disp: , Rfl:    furosemide (LASIX) 40 MG tablet, Take 40 mg by mouth daily., Disp: , Rfl:    hydroxypropyl methylcellulose / hypromellose (ISOPTO TEARS / GONIOVISC) 2.5 % ophthalmic solution, Place 1 drop into both eyes as needed for dry eyes., Disp: , Rfl:    levothyroxine (SYNTHROID, LEVOTHROID) 75 MCG tablet, Take 75 mcg by mouth daily., Disp: , Rfl:    meclizine (ANTIVERT) 12.5 MG tablet, Take 12.5 mg by mouth 3 (three) times daily as needed for dizziness., Disp: , Rfl:    Rosuvastatin Calcium 20 MG CPSP, Take 1 tablet by mouth once a week., Disp: , Rfl:    Vital signs in last 24 hrs: Vitals:   09/25/20 1458  BP: 120/72  Pulse: 73   Wt Readings from Last 3 Encounters:  09/25/20 112 lb (50.8 kg)  01/21/20 114 lb 4  oz (51.8 kg)  03/12/19 107 lb (48.5 kg)    Physical Exam  Thin, her functional status is noticeably declined since I last saw her.  Normal vocal quality. HEENT: sclera anicteric, oral mucosa moist without lesions no scar from any prior neck surgery (and says she has not had any), and no kyphosis. Neck: supple, no thyromegaly, JVD or lymphadenopathy or mass Cardiac: RRR without murmurs, S1S2 heard, no peripheral edema Pulm: clear to auscultation bilaterally, normal RR and effort noted Abdomen: soft, no tenderness, with active bowel sounds. No guarding or  palpable hepatosplenomegaly.  Labs:   ___________________________________________ Radiologic studies:   ____________________________________________ Other:   _____________________________________________ Assessment & Plan  Assessment: Encounter Diagnosis  Name Primary?   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 I recommended a modified barium swallow, but then she said "I just cannot do it", stating that she has vomited up barium in the past.  She did not understand why I could not or would not do an upper endoscopy, since she says her primary care doctor sent her here to have it done and also told her that they could send her to other practices as well.  The communication about this has been an ongoing challenge, but another upper endoscopy will not be any more productive than the last one we did for the same issue. I told her that if MBS were done, they might find certain swallow techniques or perhaps recommend certain food consistencies.  If not that, then consideration could be given to a feeding tube, which she says she would never want.  Again, these are discussions we have had in the past.  I left it open for her to consider it further and contact us if she decides to have a modified barium swallow.  22 minutes were spent on this encounter (including chart review, history/exam, counseling/coordination of care, and documentation) > 50% of that time was spent on counseling and coordination of care.  Topics discussed included: See above.  [name]

## 2020-09-25 NOTE — Patient Instructions (Signed)
If you are age 81 or older, your body mass index should be between 23-30. Your Body mass index is 21.51 kg/m. If this is out of the aforementioned range listed, please consider follow up with your Primary Care Provider.  If you are age 62 or younger, your body mass index should be between 19-25. Your Body mass index is 21.51 kg/m. If this is out of the aformentioned range listed, please consider follow up with your Primary Care Provider.   __________________________________________________________  The [facility] providers would like to encourage you to use [facility] to communicate with providers for non-urgent requests or questions.  Due to long hold times on the telephone, sending your provider a message by [facility] may be a faster and more efficient way to get a response.  Please allow 48 business hours for a response.  Please remember that this is for non-urgent requests.  It was a pleasure to see you today!  Thank you for trusting me with your gastrointestinal care!

## 2020-09-30 DIAGNOSIS — L03116 Cellulitis of left lower limb: Secondary | ICD-10-CM | POA: Diagnosis not present

## 2020-09-30 DIAGNOSIS — Z6821 Body mass index (BMI) 21.0-21.9, adult: Secondary | ICD-10-CM | POA: Diagnosis not present

## 2020-09-30 DIAGNOSIS — E538 Deficiency of other specified B group vitamins: Secondary | ICD-10-CM | POA: Diagnosis not present

## 2020-10-08 DIAGNOSIS — H6123 Impacted cerumen, bilateral: Secondary | ICD-10-CM | POA: Diagnosis not present

## 2020-10-15 DIAGNOSIS — M79605 Pain in left leg: Secondary | ICD-10-CM | POA: Diagnosis not present

## 2020-10-15 DIAGNOSIS — D519 Vitamin B12 deficiency anemia, unspecified: Secondary | ICD-10-CM | POA: Diagnosis not present

## 2020-10-15 DIAGNOSIS — Z6821 Body mass index (BMI) 21.0-21.9, adult: Secondary | ICD-10-CM | POA: Diagnosis not present

## 2020-10-15 DIAGNOSIS — D510 Vitamin B12 deficiency anemia due to intrinsic factor deficiency: Secondary | ICD-10-CM | POA: Diagnosis not present

## 2020-10-28 DIAGNOSIS — E538 Deficiency of other specified B group vitamins: Secondary | ICD-10-CM | POA: Diagnosis not present

## 2020-11-04 DIAGNOSIS — N1832 Chronic kidney disease, stage 3b: Secondary | ICD-10-CM | POA: Diagnosis not present

## 2020-11-04 DIAGNOSIS — K2100 Gastro-esophageal reflux disease with esophagitis, without bleeding: Secondary | ICD-10-CM | POA: Diagnosis not present

## 2020-11-04 DIAGNOSIS — E782 Mixed hyperlipidemia: Secondary | ICD-10-CM | POA: Diagnosis not present

## 2020-11-04 DIAGNOSIS — E1142 Type 2 diabetes mellitus with diabetic polyneuropathy: Secondary | ICD-10-CM | POA: Diagnosis not present

## 2020-11-04 DIAGNOSIS — E7849 Other hyperlipidemia: Secondary | ICD-10-CM | POA: Diagnosis not present

## 2020-11-04 DIAGNOSIS — I10 Essential (primary) hypertension: Secondary | ICD-10-CM | POA: Diagnosis not present

## 2020-11-04 DIAGNOSIS — E039 Hypothyroidism, unspecified: Secondary | ICD-10-CM | POA: Diagnosis not present

## 2020-11-04 DIAGNOSIS — E1165 Type 2 diabetes mellitus with hyperglycemia: Secondary | ICD-10-CM | POA: Diagnosis not present

## 2020-11-04 DIAGNOSIS — E1122 Type 2 diabetes mellitus with diabetic chronic kidney disease: Secondary | ICD-10-CM | POA: Diagnosis not present

## 2020-11-09 DIAGNOSIS — R4582 Worries: Secondary | ICD-10-CM | POA: Diagnosis not present

## 2020-11-09 DIAGNOSIS — E1142 Type 2 diabetes mellitus with diabetic polyneuropathy: Secondary | ICD-10-CM | POA: Diagnosis not present

## 2020-11-09 DIAGNOSIS — E039 Hypothyroidism, unspecified: Secondary | ICD-10-CM | POA: Diagnosis not present

## 2020-11-09 DIAGNOSIS — E7849 Other hyperlipidemia: Secondary | ICD-10-CM | POA: Diagnosis not present

## 2020-11-09 DIAGNOSIS — D519 Vitamin B12 deficiency anemia, unspecified: Secondary | ICD-10-CM | POA: Diagnosis not present

## 2020-11-09 DIAGNOSIS — I10 Essential (primary) hypertension: Secondary | ICD-10-CM | POA: Diagnosis not present

## 2020-11-09 DIAGNOSIS — F411 Generalized anxiety disorder: Secondary | ICD-10-CM | POA: Diagnosis not present

## 2020-11-09 DIAGNOSIS — E1122 Type 2 diabetes mellitus with diabetic chronic kidney disease: Secondary | ICD-10-CM | POA: Diagnosis not present

## 2020-11-12 DIAGNOSIS — L84 Corns and callosities: Secondary | ICD-10-CM | POA: Diagnosis not present

## 2020-11-12 DIAGNOSIS — B351 Tinea unguium: Secondary | ICD-10-CM | POA: Diagnosis not present

## 2020-11-12 DIAGNOSIS — M79676 Pain in unspecified toe(s): Secondary | ICD-10-CM | POA: Diagnosis not present

## 2020-11-12 DIAGNOSIS — E1142 Type 2 diabetes mellitus with diabetic polyneuropathy: Secondary | ICD-10-CM | POA: Diagnosis not present

## 2020-11-16 DIAGNOSIS — E538 Deficiency of other specified B group vitamins: Secondary | ICD-10-CM | POA: Diagnosis not present

## 2020-11-19 ENCOUNTER — Telehealth: Payer: Self-pay | Admitting: Gastroenterology

## 2020-11-19 MED ORDER — BUDESONIDE 3 MG PO CPEP: 3.0000 mg | ORAL_CAPSULE | Freq: Every day | ORAL | 1 refills | Status: DC

## 2020-11-19 NOTE — Telephone Encounter (Signed)
Rx sent to requested pharmacy

## 2020-11-19 NOTE — Telephone Encounter (Signed)
Inbound call from patient requesting med refill for budesonide '3mg'$  sent [facility]

## 2020-11-23 DIAGNOSIS — E538 Deficiency of other specified B group vitamins: Secondary | ICD-10-CM | POA: Diagnosis not present

## 2020-12-04 DIAGNOSIS — E538 Deficiency of other specified B group vitamins: Secondary | ICD-10-CM | POA: Diagnosis not present

## 2020-12-10 DIAGNOSIS — H6123 Impacted cerumen, bilateral: Secondary | ICD-10-CM | POA: Diagnosis not present

## 2020-12-11 ENCOUNTER — Other Ambulatory Visit: Payer: Self-pay

## 2020-12-11 ENCOUNTER — Ambulatory Visit (HOSPITAL_COMMUNITY): Payer: Medicare Other | Attending: Family Medicine | Admitting: Physical Therapy

## 2020-12-11 DIAGNOSIS — S81802S Unspecified open wound, left lower leg, sequela: Secondary | ICD-10-CM | POA: Diagnosis not present

## 2020-12-11 NOTE — Therapy (Signed)
[facility] [place], [place], [address] Phone: [phone]   Fax:  [phone]  Wound Care Evaluation  Patient Details  Name: Ellen Cruz MRN: [id number] Date of Birth: [birth year] No data recorded  Encounter Date: 12/11/2020   PT End of Session - 12/11/20 1605     Visit Number 1    Number of Visits 1    Authorization Type medicare    PT Start Time [id number]    PT Stop Time 1555    PT Time Calculation (min) 25 min    Activity Tolerance Patient tolerated treatment well    Behavior During Therapy [facility] for tasks assessed/performed             Past Medical History:  Diagnosis Date   Anxiety    At risk for falling    has cane   Cataract    bilateral cataracts removed   Chronic kidney disease    stage 3/  per pt   COVID    feb. 2022   Depression    Diabetes mellitus without complication ([place])    no meds/controlled by diet   Diarrhea    GERD (gastroesophageal reflux disease)    Microscopic colitis    Stroke ([place])    x 2   Thyroid disease     Past Surgical History:  Procedure Laterality Date   APPENDECTOMY     CHOLECYSTECTOMY     COLONOSCOPY     HEMORRHOID SURGERY     3 times   TONSILLECTOMY     UPPER GASTROINTESTINAL ENDOSCOPY     VAGINAL HYSTERECTOMY      There were no vitals filed for this visit.      Wound Therapy - 12/11/20 0001     Subjective Pt states that she had a scab on her Left leg since early August.  She states that the scab came off and the area is healed but her ankle is still swollen.  Pt is concerned about a scab on her Right leg below her knee as well as a small skin tear on her Lt wrist.    Patient and Family Stated Goals decreased swelling, wounds to be healed    Date of Onset 11/19/20    Prior Treatments self care    Evaluation and Treatment Procedures Explained to Patient/Family Yes    Evaluation and Treatment Procedures agreed to    Wound Therapy - Clinical Statement  Therapist explained that there was no wound to be treated on her left leg.  The Right scab is secure and should not be touched, allow it to fall of naturally like her left and it will most likely be healed.  Therapist explained that she could not treat the skin tear on her arm as she does not have orders for this but it is clean.  She should gently cleanse area and moisturize    Wound Therapy - Functional Problem List pt concerned about not being able to shave her legs.  Therapist explained that she doesn't have to conform to society and not to worry about shaving her legs    Factors Delaying/Impairing Wound Healing Vascular compromise    Wound Plan There is no wound but pt skin is very dry therapist stressed the importance of moisturizing the skin daily as well as using compression garments for the swelling.  Therapist measured pt for compression garment; recommended 15-17m hg and gave pt brochure [place]  Objective measurements completed on examination: See above findings.             PT Education - 12/11/20 1604     Education Details compression garment will be effective to keep swelling down, wound on Lt LE is healed    Person(s) Educated Patient    Methods Explanation;Handout    Comprehension Verbalized understanding              PT Short Term Goals - 12/11/20 1608       PT SHORT TERM GOAL #1   Title PT to realize she does not have to shave her legs, it is best to leave scabs alone, moisturizing skin will improve skin integritey and compression garments will assist with swelling    Time 1    Period Days    Status New    Target Date 12/11/20                     Plan - 12/11/20 1606     Clinical Impression Statement as above    Personal Factors and Comorbidities Age    Examination-Activity Limitations Other    Stability/Clinical Decision Making Stable/Uncomplicated    Clinical Decision Making Low     Rehab Potential Excellent    PT Frequency 1x / week    PT Duration --   1 week;   PT Treatment/Interventions Patient/family education    PT Next Visit Plan none    PT Home Exercise Plan moisturize and wear compression             Patient will benefit from skilled therapeutic intervention in order to improve the following deficits and impairments:  Increased edema, Decreased skin integrity  Visit Diagnosis: Leg wound, left, sequela    Problem List There are no problems to display for this patient.  Rayetta Humphrey, PT CLT [id number]  12/11/2020, 4:10 PM  [facility] [address] [place], [place], [address] Phone: [phone]   Fax:  [phone]  Name: Ellen Cruz MRN: [id number] Date of Birth: [birth year]

## 2020-12-14 DIAGNOSIS — E538 Deficiency of other specified B group vitamins: Secondary | ICD-10-CM | POA: Diagnosis not present

## 2020-12-25 ENCOUNTER — Other Ambulatory Visit: Payer: Self-pay

## 2020-12-25 ENCOUNTER — Telehealth: Payer: Self-pay

## 2020-12-25 DIAGNOSIS — I83892 Varicose veins of left lower extremities with other complications: Secondary | ICD-10-CM

## 2020-12-25 NOTE — Telephone Encounter (Signed)
Patient calls today to confirm appt. She is exceedingly anxious and stated 3x, I am scared to death, you just don't know." Advised her of ultrasound and she is afraid to have her legs touched. States, "I'm gonna take a pill, you can't cut on me - you can't put a stitch in me before it will bust open." Explained to patient that she would not be having surgery at this appt. She verbalizes continued concerns, but will be at  her appt next week.

## 2020-12-30 DIAGNOSIS — Z23 Encounter for immunization: Secondary | ICD-10-CM | POA: Diagnosis not present

## 2020-12-30 DIAGNOSIS — L570 Actinic keratosis: Secondary | ICD-10-CM | POA: Diagnosis not present

## 2020-12-30 DIAGNOSIS — E538 Deficiency of other specified B group vitamins: Secondary | ICD-10-CM | POA: Diagnosis not present

## 2020-12-30 DIAGNOSIS — I83893 Varicose veins of bilateral lower extremities with other complications: Secondary | ICD-10-CM | POA: Diagnosis not present

## 2020-12-31 ENCOUNTER — Ambulatory Visit (HOSPITAL_COMMUNITY)
Admission: RE | Admit: 2020-12-31 | Discharge: 2020-12-31 | Disposition: A | Discharge status: Home / Self Care | Payer: Medicare Other | Source: Ambulatory Visit | Attending: Vascular Surgery | Admitting: Vascular Surgery

## 2020-12-31 ENCOUNTER — Ambulatory Visit (INDEPENDENT_AMBULATORY_CARE_PROVIDER_SITE_OTHER): Payer: Medicare Other | Admitting: Physician Assistant

## 2020-12-31 ENCOUNTER — Other Ambulatory Visit: Payer: Self-pay

## 2020-12-31 VITALS — BP 173/92 | HR 75 | Temp 97.3°F | Resp 14 | Ht 60.0 in | Wt 113.0 lb

## 2020-12-31 DIAGNOSIS — M25562 Pain in left knee: Secondary | ICD-10-CM

## 2020-12-31 DIAGNOSIS — I83892 Varicose veins of left lower extremities with other complications: Secondary | ICD-10-CM | POA: Diagnosis not present

## 2020-12-31 DIAGNOSIS — M25561 Pain in right knee: Secondary | ICD-10-CM

## 2020-12-31 DIAGNOSIS — I872 Venous insufficiency (chronic) (peripheral): Secondary | ICD-10-CM | POA: Diagnosis not present

## 2020-12-31 DIAGNOSIS — I8390 Asymptomatic varicose veins of unspecified lower extremity: Secondary | ICD-10-CM | POA: Diagnosis not present

## 2020-12-31 NOTE — Progress Notes (Signed)
Requested by:  Caryl Bis, MD [place],  [place] [address]  Reason for consultation: left ankle swelling; leg pain    History of Present Illness   Ellen Cruz is a 80 y.o. (26-Oct-1940) female who presents for evaluation of bilateral lower extremity pain "for a Long time." Constant. Occurs with walking but done not resolve with rest.  She indicates the pain as being at the approximate level of her lateral tibial epicondyles bilaterally.  Dependency does not alleviate. No prior history of DVT or vein procedures.  Venous symptoms include: positive if (X) [  x] aching [  ] heavy [  ] tired  [  ] throbbing [  ] burning  [  ] itching [  ]swelling [  ] bleeding [  ] ulcer  Onset/duration:  months  Occupation:  retired Aggravating factors: none Alleviating factors: none Compression:  yes Helps:  yes Pain medications:  no Previous vein procedures:  no History of DVT:  no  Past Medical History:  Diagnosis Date   Anxiety    At risk for falling    has cane   Cataract    bilateral cataracts removed   Chronic kidney disease    stage 3/  per pt   COVID    feb. 2022   Depression    Diabetes mellitus without complication ([place])    no meds/controlled by diet   Diarrhea    GERD (gastroesophageal reflux disease)    Microscopic colitis    Stroke ([place])    x 2   Thyroid disease     Past Surgical History:  Procedure Laterality Date   APPENDECTOMY     CHOLECYSTECTOMY     COLONOSCOPY     HEMORRHOID SURGERY     3 times   TONSILLECTOMY     UPPER GASTROINTESTINAL ENDOSCOPY     VAGINAL HYSTERECTOMY      Social History   Socioeconomic History   Marital status: Married    Spouse name: Publishing rights manager   Number of children: 2   Years of education: Not on file   Highest education level: Not on file  Occupational History   Occupation: retired  Tobacco Use   Smoking status: Never   Smokeless tobacco: Never  Vaping Use   Vaping Use: Never used  Substance and  Sexual Activity   Alcohol use: Never   Drug use: Never   Sexual activity: Not on file  Other Topics Concern   Not on file  Social History Narrative   Not on file   Social Determinants of Health   Financial Resource Strain: Not on file  Food Insecurity: Not on file  Transportation Needs: Not on file  Physical Activity: Not on file  Stress: Not on file  Social Connections: Not on file  Intimate Partner Violence: Not on file    Family History  Problem Relation Age of Onset   Colon cancer Father        does not know how old he was when dx - lived for 77 years after sx   Stomach cancer Neg Hx    Pancreatic cancer Neg Hx    Esophageal cancer Neg Hx    Prostate cancer Neg Hx    Rectal cancer Neg Hx     Current Outpatient Medications  Medication Sig Dispense Refill   aspirin 81 MG EC tablet Take 81 mg by mouth daily. Swallow whole.     budesonide (ENTOCORT EC) 3 MG 24  hr capsule Take 1 capsule (3 mg total) by mouth daily. 90 capsule 1   Cholecalciferol (VITAMIN D3) 25 MCG (1000 UT) CAPS Take 1 capsule by mouth daily.     diazepam (VALIUM) 10 MG tablet Take 10 mg by mouth 3 (three) times daily as needed for anxiety.     Fexofenadine-Pseudoephedrine (ALLEGRA-D 24 HOUR PO) Take 1 tablet by mouth daily.     furosemide (LASIX) 40 MG tablet Take 40 mg by mouth daily.     hydroxypropyl methylcellulose / hypromellose (ISOPTO TEARS / GONIOVISC) 2.5 % ophthalmic solution Place 1 drop into both eyes as needed for dry eyes.     levothyroxine (SYNTHROID, LEVOTHROID) 75 MCG tablet Take 75 mcg by mouth daily.     meclizine (ANTIVERT) 12.5 MG tablet Take 12.5 mg by mouth 3 (three) times daily as needed for dizziness.     Rosuvastatin Calcium 20 MG CPSP Take 1 tablet by mouth once a week.     No current facility-administered medications for this visit.    Allergies  Allergen Reactions   Glycopyrrolate Other (See Comments)    "cotton mouth" and blurred vision   Asa [Aspirin] Other (See  Comments)    Dr. Orma Render took pt off ASA, Tylenol d/t pt's mother having kidney failure   Codeine Nausea Only   Lactose Intolerance (Gi)     Causes diarrhea   Lexapro [Escitalopram Oxalate] Palpitations   Phenothiazines Rash   Statins Other (See Comments)    Muscle aches   Sulfa Antibiotics Nausea Only   Zyrtec [Cetirizine] Rash    REVIEW OF SYSTEMS (negative unless checked):   Cardiac:  '[]'$  Chest pain or chest pressure? '[]'$  Shortness of breath upon activity? '[]'$  Shortness of breath when lying flat? '[]'$  Irregular heart rhythm?  Vascular:  '[x]'$  Pain in calf, thigh, or hip brought on by walking? '[]'$  Pain in feet at night that wakes you up from your sleep? '[]'$  Blood clot in your veins? '[x]'$  Leg swelling?  Pulmonary:  '[]'$  Oxygen at home? '[]'$  Productive cough? '[]'$  Wheezing?  Neurologic:  '[]'$  Sudden weakness in arms or legs? '[]'$  Sudden numbness in arms or legs? '[]'$  Sudden onset of difficult speaking or slurred speech? '[]'$  Temporary loss of vision in one eye? '[]'$  Problems with dizziness?  Gastrointestinal:  '[]'$  Blood in stool? '[]'$  Vomited blood?  Genitourinary:  '[]'$  Burning when urinating? '[]'$  Blood in urine?  Psychiatric:  '[]'$  Major depression  Hematologic:  '[]'$  Bleeding problems? '[]'$  Problems with blood clotting?  Dermatologic:  '[]'$  Rashes or ulcers?  /Constitutional: / '[]'$  Fever or chills?  Ear/Nose/Throat:  '[]'$  Change in hearing? '[]'$  Nose bleeds? '[]'$  Sore throat?  Musculoskeletal:  '[]'$  Back pain? '[]'$  Joint pain? '[]'$  Muscle pain?   Physical Examination     Vitals:   12/31/20 1338  BP: (!) 173/92  Pulse: 75  Resp: 14  Temp: (!) 97.3 F (36.3 C)  SpO2: 97%    General:  WDWN in NAD; vital signs documented above Gait: Not observed HENT: WNL, normocephalic Pulmonary: normal non-labored breathing , without Rales, rhonchi,  wheezing Cardiac: regular HR, without  Murmurs without carotid bruits Abdomen: soft, NT, no masses Skin: with rashes Vascular Exam/Pulses: 2+   posterior tibial pulses bilaterally Extremities: without varicose veins, with reticular veins, without edema, with stasis pigmentation, without lipodermatosclerosis, without ulcers Musculoskeletal: no muscle wasting or atrophy  Neurologic: A&O X 3;  No focal weakness or paresthesias are detected Psychiatric:  The pt has Normal affect.  Non-invasive Vascular Imaging  BLE Venous Insufficiency Duplex  9/15/ Summary:  Bilateral:  - No evidence of deep vein thrombosis seen in the lower extremities,  bilaterally, from the common femoral through the popliteal veins.  - No evidence of superficial venous thrombosis in the lower extremities,  bilaterally.  - No evidence of deep venous insufficiency seen bilaterally in the lower  extremity.  - No evidence of superficial venous reflux seen in the greater saphenous  veins bilaterally.  - No evidence of superficial venous reflux seen in the short saphenous  veins bilaterally.     *See table(s) above for measurements and observations.     Preliminary   Medical Decision Making   Ellen Cruz is a 80 y.o. female who presents with: BLE chronic lower extremity pain.  She has no evidence of arterial insufficiency or venous reflux.  She has what appears to be hemosiderin staining consistent with venous insufficiency.  Based on the patient's history and examination, I recommend: 15 to 20 mmHg knee-high compression stockings and daily elevation above heart level.  She is given written information with instructions.. She should also use moisturizing cleansers and emollient creams to encourage well hydrated skin. Thank you for allowing [place] to participate in this patient's care.   Barbie Banner, PA-C [facility] Office: [phone]  12/31/2020, 1:39 PM  Clinic MD: Dr. Stanford Breed on call

## 2021-01-08 DIAGNOSIS — L820 Inflamed seborrheic keratosis: Secondary | ICD-10-CM | POA: Diagnosis not present

## 2021-01-08 DIAGNOSIS — Z23 Encounter for immunization: Secondary | ICD-10-CM | POA: Diagnosis not present

## 2021-01-19 DIAGNOSIS — D0471 Carcinoma in situ of skin of right lower limb, including hip: Secondary | ICD-10-CM | POA: Diagnosis not present

## 2021-01-19 DIAGNOSIS — C44722 Squamous cell carcinoma of skin of right lower limb, including hip: Secondary | ICD-10-CM | POA: Diagnosis not present

## 2021-02-04 DIAGNOSIS — C44722 Squamous cell carcinoma of skin of right lower limb, including hip: Secondary | ICD-10-CM | POA: Diagnosis not present

## 2021-02-13 DIAGNOSIS — E1165 Type 2 diabetes mellitus with hyperglycemia: Secondary | ICD-10-CM | POA: Diagnosis not present

## 2021-02-15 DIAGNOSIS — I10 Essential (primary) hypertension: Secondary | ICD-10-CM | POA: Diagnosis not present

## 2021-02-15 DIAGNOSIS — E1142 Type 2 diabetes mellitus with diabetic polyneuropathy: Secondary | ICD-10-CM | POA: Diagnosis not present

## 2021-02-15 DIAGNOSIS — E039 Hypothyroidism, unspecified: Secondary | ICD-10-CM | POA: Diagnosis not present

## 2021-02-15 DIAGNOSIS — E538 Deficiency of other specified B group vitamins: Secondary | ICD-10-CM | POA: Diagnosis not present

## 2021-02-15 DIAGNOSIS — E1122 Type 2 diabetes mellitus with diabetic chronic kidney disease: Secondary | ICD-10-CM | POA: Diagnosis not present

## 2021-02-15 DIAGNOSIS — K580 Irritable bowel syndrome with diarrhea: Secondary | ICD-10-CM | POA: Diagnosis not present

## 2021-02-15 DIAGNOSIS — D692 Other nonthrombocytopenic purpura: Secondary | ICD-10-CM | POA: Diagnosis not present

## 2021-02-15 DIAGNOSIS — R4582 Worries: Secondary | ICD-10-CM | POA: Diagnosis not present

## 2021-02-18 DIAGNOSIS — M79676 Pain in unspecified toe(s): Secondary | ICD-10-CM | POA: Diagnosis not present

## 2021-02-18 DIAGNOSIS — L84 Corns and callosities: Secondary | ICD-10-CM | POA: Diagnosis not present

## 2021-02-18 DIAGNOSIS — B351 Tinea unguium: Secondary | ICD-10-CM | POA: Diagnosis not present

## 2021-02-18 DIAGNOSIS — E1142 Type 2 diabetes mellitus with diabetic polyneuropathy: Secondary | ICD-10-CM | POA: Diagnosis not present

## 2021-02-25 DIAGNOSIS — H6123 Impacted cerumen, bilateral: Secondary | ICD-10-CM | POA: Diagnosis not present

## 2021-02-26 DIAGNOSIS — Z6821 Body mass index (BMI) 21.0-21.9, adult: Secondary | ICD-10-CM | POA: Diagnosis not present

## 2021-02-26 DIAGNOSIS — E538 Deficiency of other specified B group vitamins: Secondary | ICD-10-CM | POA: Diagnosis not present

## 2021-02-26 DIAGNOSIS — S8012XA Contusion of left lower leg, initial encounter: Secondary | ICD-10-CM | POA: Diagnosis not present

## 2021-02-26 DIAGNOSIS — E1142 Type 2 diabetes mellitus with diabetic polyneuropathy: Secondary | ICD-10-CM | POA: Diagnosis not present

## 2021-02-26 DIAGNOSIS — D692 Other nonthrombocytopenic purpura: Secondary | ICD-10-CM | POA: Diagnosis not present

## 2021-03-02 DIAGNOSIS — E876 Hypokalemia: Secondary | ICD-10-CM | POA: Diagnosis not present

## 2021-04-22 DIAGNOSIS — M79676 Pain in unspecified toe(s): Secondary | ICD-10-CM | POA: Diagnosis not present

## 2021-04-22 DIAGNOSIS — E1142 Type 2 diabetes mellitus with diabetic polyneuropathy: Secondary | ICD-10-CM | POA: Diagnosis not present

## 2021-04-22 DIAGNOSIS — B351 Tinea unguium: Secondary | ICD-10-CM | POA: Diagnosis not present

## 2021-04-22 DIAGNOSIS — L84 Corns and callosities: Secondary | ICD-10-CM | POA: Diagnosis not present

## 2021-05-13 DIAGNOSIS — E1122 Type 2 diabetes mellitus with diabetic chronic kidney disease: Secondary | ICD-10-CM | POA: Diagnosis not present

## 2021-05-13 DIAGNOSIS — I10 Essential (primary) hypertension: Secondary | ICD-10-CM | POA: Diagnosis not present

## 2021-05-13 DIAGNOSIS — E039 Hypothyroidism, unspecified: Secondary | ICD-10-CM | POA: Diagnosis not present

## 2021-05-13 DIAGNOSIS — R5383 Other fatigue: Secondary | ICD-10-CM | POA: Diagnosis not present

## 2021-05-13 DIAGNOSIS — H6123 Impacted cerumen, bilateral: Secondary | ICD-10-CM | POA: Diagnosis not present

## 2021-05-13 DIAGNOSIS — K2100 Gastro-esophageal reflux disease with esophagitis, without bleeding: Secondary | ICD-10-CM | POA: Diagnosis not present

## 2021-05-13 DIAGNOSIS — N1832 Chronic kidney disease, stage 3b: Secondary | ICD-10-CM | POA: Diagnosis not present

## 2021-05-13 DIAGNOSIS — E7849 Other hyperlipidemia: Secondary | ICD-10-CM | POA: Diagnosis not present

## 2021-05-13 DIAGNOSIS — E782 Mixed hyperlipidemia: Secondary | ICD-10-CM | POA: Diagnosis not present

## 2021-05-21 DIAGNOSIS — I10 Essential (primary) hypertension: Secondary | ICD-10-CM | POA: Diagnosis not present

## 2021-05-21 DIAGNOSIS — E1142 Type 2 diabetes mellitus with diabetic polyneuropathy: Secondary | ICD-10-CM | POA: Diagnosis not present

## 2021-05-21 DIAGNOSIS — R4582 Worries: Secondary | ICD-10-CM | POA: Diagnosis not present

## 2021-05-21 DIAGNOSIS — E538 Deficiency of other specified B group vitamins: Secondary | ICD-10-CM | POA: Diagnosis not present

## 2021-05-21 DIAGNOSIS — K580 Irritable bowel syndrome with diarrhea: Secondary | ICD-10-CM | POA: Diagnosis not present

## 2021-05-21 DIAGNOSIS — D692 Other nonthrombocytopenic purpura: Secondary | ICD-10-CM | POA: Diagnosis not present

## 2021-05-21 DIAGNOSIS — M79672 Pain in left foot: Secondary | ICD-10-CM | POA: Diagnosis not present

## 2021-05-21 DIAGNOSIS — E1122 Type 2 diabetes mellitus with diabetic chronic kidney disease: Secondary | ICD-10-CM | POA: Diagnosis not present

## 2021-06-04 DIAGNOSIS — K529 Noninfective gastroenteritis and colitis, unspecified: Secondary | ICD-10-CM | POA: Diagnosis not present

## 2021-06-04 DIAGNOSIS — E860 Dehydration: Secondary | ICD-10-CM | POA: Diagnosis not present

## 2021-06-18 DIAGNOSIS — E538 Deficiency of other specified B group vitamins: Secondary | ICD-10-CM | POA: Diagnosis not present

## 2021-06-24 DIAGNOSIS — E1142 Type 2 diabetes mellitus with diabetic polyneuropathy: Secondary | ICD-10-CM | POA: Diagnosis not present

## 2021-06-24 DIAGNOSIS — M79676 Pain in unspecified toe(s): Secondary | ICD-10-CM | POA: Diagnosis not present

## 2021-06-24 DIAGNOSIS — B351 Tinea unguium: Secondary | ICD-10-CM | POA: Diagnosis not present

## 2021-06-24 DIAGNOSIS — L84 Corns and callosities: Secondary | ICD-10-CM | POA: Diagnosis not present

## 2021-07-09 DIAGNOSIS — S81812A Laceration without foreign body, left lower leg, initial encounter: Secondary | ICD-10-CM | POA: Diagnosis not present

## 2021-07-12 DIAGNOSIS — S81812A Laceration without foreign body, left lower leg, initial encounter: Secondary | ICD-10-CM | POA: Diagnosis not present

## 2021-07-12 DIAGNOSIS — Z6820 Body mass index (BMI) 20.0-20.9, adult: Secondary | ICD-10-CM | POA: Diagnosis not present

## 2021-07-14 DIAGNOSIS — S81812A Laceration without foreign body, left lower leg, initial encounter: Secondary | ICD-10-CM | POA: Diagnosis not present

## 2021-07-14 DIAGNOSIS — Z6820 Body mass index (BMI) 20.0-20.9, adult: Secondary | ICD-10-CM | POA: Diagnosis not present

## 2021-07-16 DIAGNOSIS — F411 Generalized anxiety disorder: Secondary | ICD-10-CM | POA: Diagnosis not present

## 2021-07-16 DIAGNOSIS — S81812A Laceration without foreign body, left lower leg, initial encounter: Secondary | ICD-10-CM | POA: Diagnosis not present

## 2021-07-16 DIAGNOSIS — Z6820 Body mass index (BMI) 20.0-20.9, adult: Secondary | ICD-10-CM | POA: Diagnosis not present

## 2021-07-16 DIAGNOSIS — F332 Major depressive disorder, recurrent severe without psychotic features: Secondary | ICD-10-CM | POA: Diagnosis not present

## 2021-07-19 DIAGNOSIS — H6123 Impacted cerumen, bilateral: Secondary | ICD-10-CM | POA: Diagnosis not present

## 2021-07-22 DIAGNOSIS — S81812A Laceration without foreign body, left lower leg, initial encounter: Secondary | ICD-10-CM | POA: Diagnosis not present

## 2021-07-30 DIAGNOSIS — S61512A Laceration without foreign body of left wrist, initial encounter: Secondary | ICD-10-CM | POA: Diagnosis not present

## 2021-07-30 DIAGNOSIS — Z6820 Body mass index (BMI) 20.0-20.9, adult: Secondary | ICD-10-CM | POA: Diagnosis not present

## 2021-08-09 DIAGNOSIS — Z20822 Contact with and (suspected) exposure to covid-19: Secondary | ICD-10-CM | POA: Diagnosis not present

## 2021-08-17 DIAGNOSIS — N183 Chronic kidney disease, stage 3 unspecified: Secondary | ICD-10-CM | POA: Diagnosis not present

## 2021-08-17 DIAGNOSIS — E1165 Type 2 diabetes mellitus with hyperglycemia: Secondary | ICD-10-CM | POA: Diagnosis not present

## 2021-08-17 DIAGNOSIS — E1142 Type 2 diabetes mellitus with diabetic polyneuropathy: Secondary | ICD-10-CM | POA: Diagnosis not present

## 2021-08-17 DIAGNOSIS — E7849 Other hyperlipidemia: Secondary | ICD-10-CM | POA: Diagnosis not present

## 2021-08-17 DIAGNOSIS — E782 Mixed hyperlipidemia: Secondary | ICD-10-CM | POA: Diagnosis not present

## 2021-08-17 DIAGNOSIS — E1122 Type 2 diabetes mellitus with diabetic chronic kidney disease: Secondary | ICD-10-CM | POA: Diagnosis not present

## 2021-08-17 DIAGNOSIS — K2100 Gastro-esophageal reflux disease with esophagitis, without bleeding: Secondary | ICD-10-CM | POA: Diagnosis not present

## 2021-08-23 DIAGNOSIS — Z1331 Encounter for screening for depression: Secondary | ICD-10-CM | POA: Diagnosis not present

## 2021-08-23 DIAGNOSIS — Z1389 Encounter for screening for other disorder: Secondary | ICD-10-CM | POA: Diagnosis not present

## 2021-08-23 DIAGNOSIS — Z0001 Encounter for general adult medical examination with abnormal findings: Secondary | ICD-10-CM | POA: Diagnosis not present

## 2021-08-23 DIAGNOSIS — E7849 Other hyperlipidemia: Secondary | ICD-10-CM | POA: Diagnosis not present

## 2021-08-23 DIAGNOSIS — E1122 Type 2 diabetes mellitus with diabetic chronic kidney disease: Secondary | ICD-10-CM | POA: Diagnosis not present

## 2021-08-23 DIAGNOSIS — I10 Essential (primary) hypertension: Secondary | ICD-10-CM | POA: Diagnosis not present

## 2021-08-23 DIAGNOSIS — E1142 Type 2 diabetes mellitus with diabetic polyneuropathy: Secondary | ICD-10-CM | POA: Diagnosis not present

## 2021-08-23 DIAGNOSIS — E538 Deficiency of other specified B group vitamins: Secondary | ICD-10-CM | POA: Diagnosis not present

## 2021-09-02 DIAGNOSIS — B351 Tinea unguium: Secondary | ICD-10-CM | POA: Diagnosis not present

## 2021-09-02 DIAGNOSIS — E1142 Type 2 diabetes mellitus with diabetic polyneuropathy: Secondary | ICD-10-CM | POA: Diagnosis not present

## 2021-09-02 DIAGNOSIS — L84 Corns and callosities: Secondary | ICD-10-CM | POA: Diagnosis not present

## 2021-09-02 DIAGNOSIS — M79676 Pain in unspecified toe(s): Secondary | ICD-10-CM | POA: Diagnosis not present

## 2021-09-14 DIAGNOSIS — Z6820 Body mass index (BMI) 20.0-20.9, adult: Secondary | ICD-10-CM | POA: Diagnosis not present

## 2021-09-14 DIAGNOSIS — I10 Essential (primary) hypertension: Secondary | ICD-10-CM | POA: Diagnosis not present

## 2021-09-14 DIAGNOSIS — R42 Dizziness and giddiness: Secondary | ICD-10-CM | POA: Diagnosis not present

## 2021-09-16 DIAGNOSIS — Z6820 Body mass index (BMI) 20.0-20.9, adult: Secondary | ICD-10-CM | POA: Diagnosis not present

## 2021-09-16 DIAGNOSIS — R42 Dizziness and giddiness: Secondary | ICD-10-CM | POA: Diagnosis not present

## 2021-09-16 DIAGNOSIS — R2689 Other abnormalities of gait and mobility: Secondary | ICD-10-CM | POA: Diagnosis not present

## 2021-09-16 DIAGNOSIS — R531 Weakness: Secondary | ICD-10-CM | POA: Diagnosis not present

## 2021-09-21 ENCOUNTER — Other Ambulatory Visit: Payer: Self-pay | Admitting: Family Medicine

## 2021-09-21 DIAGNOSIS — G459 Transient cerebral ischemic attack, unspecified: Secondary | ICD-10-CM

## 2021-09-28 DIAGNOSIS — S81802D Unspecified open wound, left lower leg, subsequent encounter: Secondary | ICD-10-CM | POA: Diagnosis not present

## 2021-09-28 DIAGNOSIS — S81802A Unspecified open wound, left lower leg, initial encounter: Secondary | ICD-10-CM | POA: Diagnosis not present

## 2021-10-02 DIAGNOSIS — R030 Elevated blood-pressure reading, without diagnosis of hypertension: Secondary | ICD-10-CM | POA: Diagnosis not present

## 2021-10-02 DIAGNOSIS — J029 Acute pharyngitis, unspecified: Secondary | ICD-10-CM | POA: Diagnosis not present

## 2021-10-02 DIAGNOSIS — Z681 Body mass index (BMI) 19 or less, adult: Secondary | ICD-10-CM | POA: Diagnosis not present

## 2021-10-03 ENCOUNTER — Ambulatory Visit
Admission: RE | Admit: 2021-10-03 | Discharge: 2021-10-03 | Disposition: A | Discharge status: Home / Self Care | Payer: Medicare Other | Source: Ambulatory Visit | Attending: Family Medicine | Admitting: Family Medicine

## 2021-10-03 DIAGNOSIS — G9389 Other specified disorders of brain: Secondary | ICD-10-CM | POA: Diagnosis not present

## 2021-10-03 DIAGNOSIS — G459 Transient cerebral ischemic attack, unspecified: Secondary | ICD-10-CM

## 2021-10-03 DIAGNOSIS — I639 Cerebral infarction, unspecified: Secondary | ICD-10-CM | POA: Diagnosis not present

## 2021-10-07 DIAGNOSIS — H6123 Impacted cerumen, bilateral: Secondary | ICD-10-CM | POA: Diagnosis not present

## 2021-10-08 DIAGNOSIS — F411 Generalized anxiety disorder: Secondary | ICD-10-CM | POA: Diagnosis not present

## 2021-10-08 DIAGNOSIS — I10 Essential (primary) hypertension: Secondary | ICD-10-CM | POA: Diagnosis not present

## 2021-10-08 DIAGNOSIS — K580 Irritable bowel syndrome with diarrhea: Secondary | ICD-10-CM | POA: Diagnosis not present

## 2021-10-08 DIAGNOSIS — E538 Deficiency of other specified B group vitamins: Secondary | ICD-10-CM | POA: Diagnosis not present

## 2021-10-08 DIAGNOSIS — E1122 Type 2 diabetes mellitus with diabetic chronic kidney disease: Secondary | ICD-10-CM | POA: Diagnosis not present

## 2021-10-08 DIAGNOSIS — E1142 Type 2 diabetes mellitus with diabetic polyneuropathy: Secondary | ICD-10-CM | POA: Diagnosis not present

## 2021-10-08 DIAGNOSIS — F331 Major depressive disorder, recurrent, moderate: Secondary | ICD-10-CM | POA: Diagnosis not present

## 2021-10-08 DIAGNOSIS — R2689 Other abnormalities of gait and mobility: Secondary | ICD-10-CM | POA: Diagnosis not present

## 2021-10-08 DIAGNOSIS — D692 Other nonthrombocytopenic purpura: Secondary | ICD-10-CM | POA: Diagnosis not present

## 2021-10-08 DIAGNOSIS — E039 Hypothyroidism, unspecified: Secondary | ICD-10-CM | POA: Diagnosis not present

## 2021-10-08 DIAGNOSIS — F332 Major depressive disorder, recurrent severe without psychotic features: Secondary | ICD-10-CM | POA: Diagnosis not present

## 2021-10-08 DIAGNOSIS — E7849 Other hyperlipidemia: Secondary | ICD-10-CM | POA: Diagnosis not present

## 2021-10-27 DIAGNOSIS — Z6820 Body mass index (BMI) 20.0-20.9, adult: Secondary | ICD-10-CM | POA: Diagnosis not present

## 2021-10-27 DIAGNOSIS — F419 Anxiety disorder, unspecified: Secondary | ICD-10-CM | POA: Diagnosis not present

## 2021-10-27 DIAGNOSIS — F331 Major depressive disorder, recurrent, moderate: Secondary | ICD-10-CM | POA: Diagnosis not present

## 2021-11-04 DIAGNOSIS — E1142 Type 2 diabetes mellitus with diabetic polyneuropathy: Secondary | ICD-10-CM | POA: Diagnosis not present

## 2021-11-04 DIAGNOSIS — L84 Corns and callosities: Secondary | ICD-10-CM | POA: Diagnosis not present

## 2021-11-04 DIAGNOSIS — M79676 Pain in unspecified toe(s): Secondary | ICD-10-CM | POA: Diagnosis not present

## 2021-11-04 DIAGNOSIS — B351 Tinea unguium: Secondary | ICD-10-CM | POA: Diagnosis not present

## 2021-11-17 DIAGNOSIS — J029 Acute pharyngitis, unspecified: Secondary | ICD-10-CM | POA: Diagnosis not present

## 2021-11-17 DIAGNOSIS — E538 Deficiency of other specified B group vitamins: Secondary | ICD-10-CM | POA: Diagnosis not present

## 2021-11-17 DIAGNOSIS — Z681 Body mass index (BMI) 19 or less, adult: Secondary | ICD-10-CM | POA: Diagnosis not present

## 2021-12-13 DIAGNOSIS — R42 Dizziness and giddiness: Secondary | ICD-10-CM | POA: Diagnosis not present

## 2021-12-13 DIAGNOSIS — Z681 Body mass index (BMI) 19 or less, adult: Secondary | ICD-10-CM | POA: Diagnosis not present

## 2021-12-16 DIAGNOSIS — H6123 Impacted cerumen, bilateral: Secondary | ICD-10-CM | POA: Diagnosis not present

## 2021-12-17 DIAGNOSIS — R531 Weakness: Secondary | ICD-10-CM | POA: Diagnosis not present

## 2021-12-17 DIAGNOSIS — J0101 Acute recurrent maxillary sinusitis: Secondary | ICD-10-CM | POA: Diagnosis not present

## 2021-12-17 DIAGNOSIS — Z681 Body mass index (BMI) 19 or less, adult: Secondary | ICD-10-CM | POA: Diagnosis not present

## 2021-12-23 DIAGNOSIS — E1165 Type 2 diabetes mellitus with hyperglycemia: Secondary | ICD-10-CM | POA: Diagnosis not present

## 2021-12-23 DIAGNOSIS — E7849 Other hyperlipidemia: Secondary | ICD-10-CM | POA: Diagnosis not present

## 2021-12-23 DIAGNOSIS — K2100 Gastro-esophageal reflux disease with esophagitis, without bleeding: Secondary | ICD-10-CM | POA: Diagnosis not present

## 2021-12-23 DIAGNOSIS — I10 Essential (primary) hypertension: Secondary | ICD-10-CM | POA: Diagnosis not present

## 2021-12-23 DIAGNOSIS — N183 Chronic kidney disease, stage 3 unspecified: Secondary | ICD-10-CM | POA: Diagnosis not present

## 2021-12-23 DIAGNOSIS — E039 Hypothyroidism, unspecified: Secondary | ICD-10-CM | POA: Diagnosis not present

## 2021-12-23 DIAGNOSIS — E1122 Type 2 diabetes mellitus with diabetic chronic kidney disease: Secondary | ICD-10-CM | POA: Diagnosis not present

## 2021-12-23 DIAGNOSIS — E1142 Type 2 diabetes mellitus with diabetic polyneuropathy: Secondary | ICD-10-CM | POA: Diagnosis not present

## 2021-12-28 DIAGNOSIS — G6289 Other specified polyneuropathies: Secondary | ICD-10-CM | POA: Diagnosis not present

## 2021-12-28 DIAGNOSIS — E1122 Type 2 diabetes mellitus with diabetic chronic kidney disease: Secondary | ICD-10-CM | POA: Diagnosis not present

## 2021-12-28 DIAGNOSIS — Z23 Encounter for immunization: Secondary | ICD-10-CM | POA: Diagnosis not present

## 2021-12-28 DIAGNOSIS — E538 Deficiency of other specified B group vitamins: Secondary | ICD-10-CM | POA: Diagnosis not present

## 2021-12-28 DIAGNOSIS — D692 Other nonthrombocytopenic purpura: Secondary | ICD-10-CM | POA: Diagnosis not present

## 2021-12-28 DIAGNOSIS — I10 Essential (primary) hypertension: Secondary | ICD-10-CM | POA: Diagnosis not present

## 2021-12-28 DIAGNOSIS — K580 Irritable bowel syndrome with diarrhea: Secondary | ICD-10-CM | POA: Diagnosis not present

## 2021-12-28 DIAGNOSIS — F411 Generalized anxiety disorder: Secondary | ICD-10-CM | POA: Diagnosis not present

## 2021-12-28 DIAGNOSIS — E039 Hypothyroidism, unspecified: Secondary | ICD-10-CM | POA: Diagnosis not present

## 2021-12-28 DIAGNOSIS — E7849 Other hyperlipidemia: Secondary | ICD-10-CM | POA: Diagnosis not present

## 2021-12-28 DIAGNOSIS — E1142 Type 2 diabetes mellitus with diabetic polyneuropathy: Secondary | ICD-10-CM | POA: Diagnosis not present

## 2021-12-28 DIAGNOSIS — R2689 Other abnormalities of gait and mobility: Secondary | ICD-10-CM | POA: Diagnosis not present

## 2022-01-13 DIAGNOSIS — B351 Tinea unguium: Secondary | ICD-10-CM | POA: Diagnosis not present

## 2022-01-13 DIAGNOSIS — E1142 Type 2 diabetes mellitus with diabetic polyneuropathy: Secondary | ICD-10-CM | POA: Diagnosis not present

## 2022-01-13 DIAGNOSIS — M79676 Pain in unspecified toe(s): Secondary | ICD-10-CM | POA: Diagnosis not present

## 2022-01-13 DIAGNOSIS — L84 Corns and callosities: Secondary | ICD-10-CM | POA: Diagnosis not present

## 2022-02-03 DIAGNOSIS — H6123 Impacted cerumen, bilateral: Secondary | ICD-10-CM | POA: Diagnosis not present

## 2022-03-01 DIAGNOSIS — Z681 Body mass index (BMI) 19 or less, adult: Secondary | ICD-10-CM | POA: Diagnosis not present

## 2022-03-01 DIAGNOSIS — J029 Acute pharyngitis, unspecified: Secondary | ICD-10-CM | POA: Diagnosis not present

## 2022-03-01 DIAGNOSIS — J301 Allergic rhinitis due to pollen: Secondary | ICD-10-CM | POA: Diagnosis not present

## 2022-03-01 DIAGNOSIS — R296 Repeated falls: Secondary | ICD-10-CM | POA: Diagnosis not present

## 2022-03-01 DIAGNOSIS — R031 Nonspecific low blood-pressure reading: Secondary | ICD-10-CM | POA: Diagnosis not present

## 2022-03-02 DIAGNOSIS — E538 Deficiency of other specified B group vitamins: Secondary | ICD-10-CM | POA: Diagnosis not present

## 2022-03-02 DIAGNOSIS — R030 Elevated blood-pressure reading, without diagnosis of hypertension: Secondary | ICD-10-CM | POA: Diagnosis not present

## 2022-03-07 DIAGNOSIS — M6281 Muscle weakness (generalized): Secondary | ICD-10-CM | POA: Diagnosis not present

## 2022-03-07 DIAGNOSIS — R2681 Unsteadiness on feet: Secondary | ICD-10-CM | POA: Diagnosis not present

## 2022-03-09 DIAGNOSIS — R2681 Unsteadiness on feet: Secondary | ICD-10-CM | POA: Diagnosis not present

## 2022-03-09 DIAGNOSIS — M6281 Muscle weakness (generalized): Secondary | ICD-10-CM | POA: Diagnosis not present

## 2022-03-14 DIAGNOSIS — R2681 Unsteadiness on feet: Secondary | ICD-10-CM | POA: Diagnosis not present

## 2022-03-14 DIAGNOSIS — M6281 Muscle weakness (generalized): Secondary | ICD-10-CM | POA: Diagnosis not present

## 2022-03-16 DIAGNOSIS — R2681 Unsteadiness on feet: Secondary | ICD-10-CM | POA: Diagnosis not present

## 2022-03-16 DIAGNOSIS — M6281 Muscle weakness (generalized): Secondary | ICD-10-CM | POA: Diagnosis not present

## 2022-03-23 DIAGNOSIS — D485 Neoplasm of uncertain behavior of skin: Secondary | ICD-10-CM | POA: Diagnosis not present

## 2022-03-23 DIAGNOSIS — L988 Other specified disorders of the skin and subcutaneous tissue: Secondary | ICD-10-CM | POA: Diagnosis not present

## 2022-03-23 DIAGNOSIS — L720 Epidermal cyst: Secondary | ICD-10-CM | POA: Diagnosis not present

## 2022-03-24 DIAGNOSIS — L84 Corns and callosities: Secondary | ICD-10-CM | POA: Diagnosis not present

## 2022-03-24 DIAGNOSIS — M79676 Pain in unspecified toe(s): Secondary | ICD-10-CM | POA: Diagnosis not present

## 2022-03-24 DIAGNOSIS — E1142 Type 2 diabetes mellitus with diabetic polyneuropathy: Secondary | ICD-10-CM | POA: Diagnosis not present

## 2022-03-24 DIAGNOSIS — B351 Tinea unguium: Secondary | ICD-10-CM | POA: Diagnosis not present

## 2022-04-07 DIAGNOSIS — H6123 Impacted cerumen, bilateral: Secondary | ICD-10-CM | POA: Diagnosis not present

## 2022-04-26 DIAGNOSIS — I10 Essential (primary) hypertension: Secondary | ICD-10-CM | POA: Diagnosis not present

## 2022-04-26 DIAGNOSIS — E538 Deficiency of other specified B group vitamins: Secondary | ICD-10-CM | POA: Diagnosis not present

## 2022-04-26 DIAGNOSIS — E1142 Type 2 diabetes mellitus with diabetic polyneuropathy: Secondary | ICD-10-CM | POA: Diagnosis not present

## 2022-04-26 DIAGNOSIS — E7849 Other hyperlipidemia: Secondary | ICD-10-CM | POA: Diagnosis not present

## 2022-04-26 DIAGNOSIS — Z23 Encounter for immunization: Secondary | ICD-10-CM | POA: Diagnosis not present

## 2022-04-26 DIAGNOSIS — E1165 Type 2 diabetes mellitus with hyperglycemia: Secondary | ICD-10-CM | POA: Diagnosis not present

## 2022-05-04 DIAGNOSIS — F411 Generalized anxiety disorder: Secondary | ICD-10-CM | POA: Diagnosis not present

## 2022-05-04 DIAGNOSIS — F332 Major depressive disorder, recurrent severe without psychotic features: Secondary | ICD-10-CM | POA: Diagnosis not present

## 2022-05-04 DIAGNOSIS — I10 Essential (primary) hypertension: Secondary | ICD-10-CM | POA: Diagnosis not present

## 2022-05-04 DIAGNOSIS — K580 Irritable bowel syndrome with diarrhea: Secondary | ICD-10-CM | POA: Diagnosis not present

## 2022-05-04 DIAGNOSIS — D692 Other nonthrombocytopenic purpura: Secondary | ICD-10-CM | POA: Diagnosis not present

## 2022-05-04 DIAGNOSIS — E538 Deficiency of other specified B group vitamins: Secondary | ICD-10-CM | POA: Diagnosis not present

## 2022-05-04 DIAGNOSIS — F331 Major depressive disorder, recurrent, moderate: Secondary | ICD-10-CM | POA: Diagnosis not present

## 2022-05-04 DIAGNOSIS — E039 Hypothyroidism, unspecified: Secondary | ICD-10-CM | POA: Diagnosis not present

## 2022-05-04 DIAGNOSIS — E782 Mixed hyperlipidemia: Secondary | ICD-10-CM | POA: Diagnosis not present

## 2022-05-04 DIAGNOSIS — R2689 Other abnormalities of gait and mobility: Secondary | ICD-10-CM | POA: Diagnosis not present

## 2022-05-04 DIAGNOSIS — J301 Allergic rhinitis due to pollen: Secondary | ICD-10-CM | POA: Diagnosis not present

## 2022-05-04 DIAGNOSIS — Z9189 Other specified personal risk factors, not elsewhere classified: Secondary | ICD-10-CM | POA: Diagnosis not present

## 2022-05-04 DIAGNOSIS — Z1212 Encounter for screening for malignant neoplasm of rectum: Secondary | ICD-10-CM | POA: Diagnosis not present

## 2022-05-04 DIAGNOSIS — E7849 Other hyperlipidemia: Secondary | ICD-10-CM | POA: Diagnosis not present

## 2022-05-04 DIAGNOSIS — E1142 Type 2 diabetes mellitus with diabetic polyneuropathy: Secondary | ICD-10-CM | POA: Diagnosis not present

## 2022-05-04 DIAGNOSIS — N1832 Chronic kidney disease, stage 3b: Secondary | ICD-10-CM | POA: Diagnosis not present

## 2022-05-04 DIAGNOSIS — E1122 Type 2 diabetes mellitus with diabetic chronic kidney disease: Secondary | ICD-10-CM | POA: Diagnosis not present

## 2022-05-04 DIAGNOSIS — R4582 Worries: Secondary | ICD-10-CM | POA: Diagnosis not present

## 2022-05-30 DIAGNOSIS — E538 Deficiency of other specified B group vitamins: Secondary | ICD-10-CM | POA: Diagnosis not present

## 2022-05-30 DIAGNOSIS — R030 Elevated blood-pressure reading, without diagnosis of hypertension: Secondary | ICD-10-CM | POA: Diagnosis not present

## 2022-06-16 DIAGNOSIS — H6123 Impacted cerumen, bilateral: Secondary | ICD-10-CM | POA: Diagnosis not present

## 2022-06-30 DIAGNOSIS — B351 Tinea unguium: Secondary | ICD-10-CM | POA: Diagnosis not present

## 2022-06-30 DIAGNOSIS — L84 Corns and callosities: Secondary | ICD-10-CM | POA: Diagnosis not present

## 2022-06-30 DIAGNOSIS — E1142 Type 2 diabetes mellitus with diabetic polyneuropathy: Secondary | ICD-10-CM | POA: Diagnosis not present

## 2022-06-30 DIAGNOSIS — M79676 Pain in unspecified toe(s): Secondary | ICD-10-CM | POA: Diagnosis not present

## 2022-07-06 DIAGNOSIS — R030 Elevated blood-pressure reading, without diagnosis of hypertension: Secondary | ICD-10-CM | POA: Diagnosis not present

## 2022-07-06 DIAGNOSIS — Z681 Body mass index (BMI) 19 or less, adult: Secondary | ICD-10-CM | POA: Diagnosis not present

## 2022-07-06 DIAGNOSIS — J301 Allergic rhinitis due to pollen: Secondary | ICD-10-CM | POA: Diagnosis not present

## 2022-07-06 DIAGNOSIS — D519 Vitamin B12 deficiency anemia, unspecified: Secondary | ICD-10-CM | POA: Diagnosis not present

## 2022-07-12 DIAGNOSIS — J329 Chronic sinusitis, unspecified: Secondary | ICD-10-CM | POA: Diagnosis not present

## 2022-07-12 DIAGNOSIS — Z681 Body mass index (BMI) 19 or less, adult: Secondary | ICD-10-CM | POA: Diagnosis not present

## 2022-07-12 DIAGNOSIS — J301 Allergic rhinitis due to pollen: Secondary | ICD-10-CM | POA: Diagnosis not present

## 2022-07-12 DIAGNOSIS — R030 Elevated blood-pressure reading, without diagnosis of hypertension: Secondary | ICD-10-CM | POA: Diagnosis not present

## 2022-07-20 DIAGNOSIS — S51812A Laceration without foreign body of left forearm, initial encounter: Secondary | ICD-10-CM | POA: Diagnosis not present

## 2022-07-20 DIAGNOSIS — S51811A Laceration without foreign body of right forearm, initial encounter: Secondary | ICD-10-CM | POA: Diagnosis not present

## 2022-07-20 DIAGNOSIS — S81811A Laceration without foreign body, right lower leg, initial encounter: Secondary | ICD-10-CM | POA: Diagnosis not present

## 2022-07-20 DIAGNOSIS — R296 Repeated falls: Secondary | ICD-10-CM | POA: Diagnosis not present

## 2022-07-20 DIAGNOSIS — R030 Elevated blood-pressure reading, without diagnosis of hypertension: Secondary | ICD-10-CM | POA: Diagnosis not present

## 2022-07-20 DIAGNOSIS — Z681 Body mass index (BMI) 19 or less, adult: Secondary | ICD-10-CM | POA: Diagnosis not present

## 2022-07-25 IMAGING — MR MR HEAD W/O CM
11 series · 48 of 48 positions shown · non-contrast
Comparison: 12/22/2019

CLINICAL DATA: TIA.  Unsteady gait

EXAM:
MRI HEAD WITHOUT CONTRAST
TECHNIQUE: Multiplanar, multiecho pulse sequences of the brain and surrounding
structures were obtained without intravenous contrast.

[Series 2: T1 · sagittal · 5.0mm · 0.45mm/px · 3 of 21 slices shown]
[im 1/21]
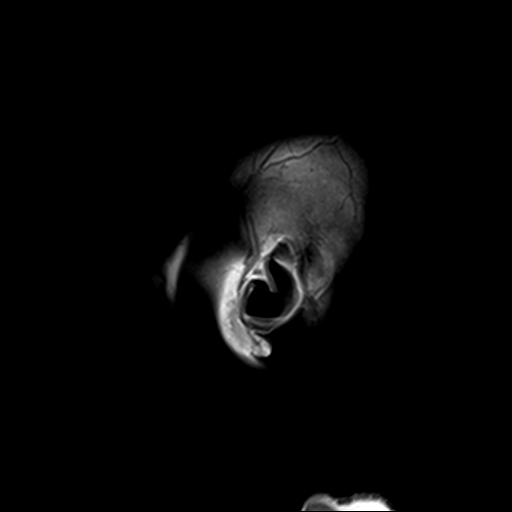
[im 11/21]
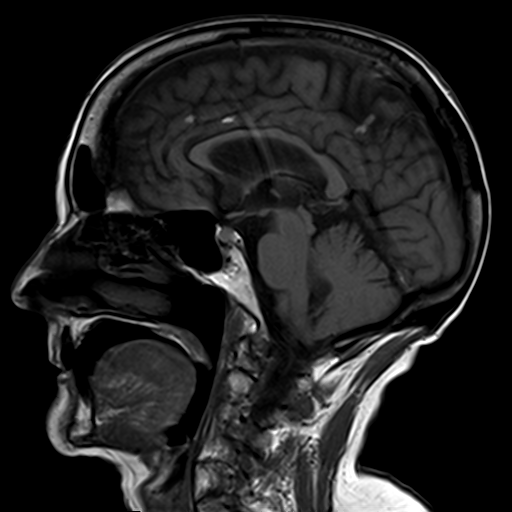
[im 21/21]
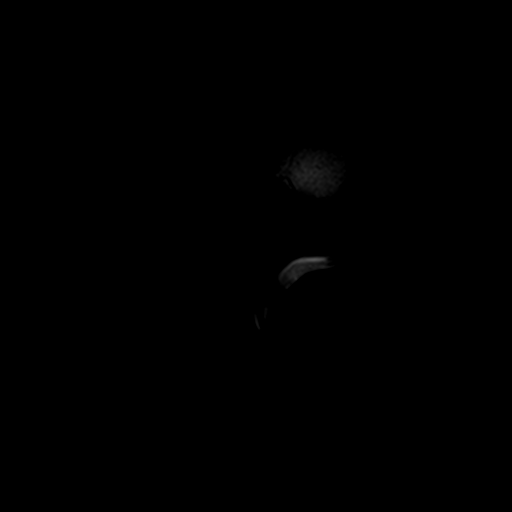

[Series 3: DWI · axial · 3.0mm · 1.80mm/px · z∈[-46,+99]mm · 8 of 98 slices shown (1 of 4)]
[im 1/98]
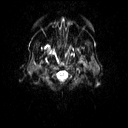
[im 14/98]
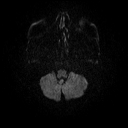
[im 28/98]
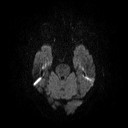
[im 42/98]
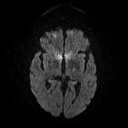
[im 56/98]
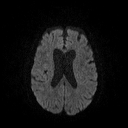
[im 70/98]
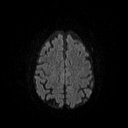
[im 84/98]
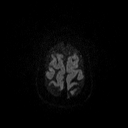
[im 98/98]
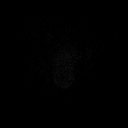

[Series 4: DWI · axial · 3.0mm · 1.80mm/px · z∈[-46,+99]mm · 4 of 49 slices shown (2 of 4)]
[im 1/49]
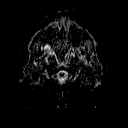
[im 17/49]
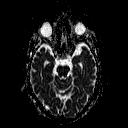
[im 33/49]
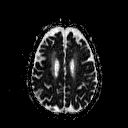
[im 49/49]
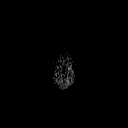

[Series 5: DWI · coronal · 5.0mm · 1.80mm/px · 6 of 72 slices shown (3 of 4)]
[im 1/72]
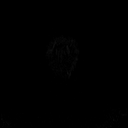
[im 15/72]
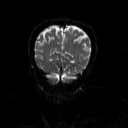
[im 29/72]
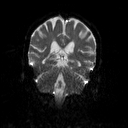
[im 43/72]
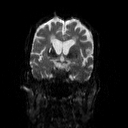
[im 57/72]
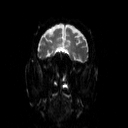
[im 72/72]
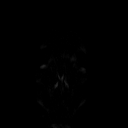

[Series 6: DWI · coronal · 5.0mm · 1.80mm/px · 3 of 36 slices shown (4 of 4)]
[im 1/36]
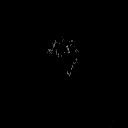
[im 18/36]
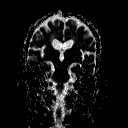
[im 36/36]
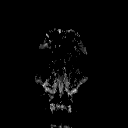

[Series 7: T2 · axial · 5.0mm · 0.51mm/px · z∈[-47,+98]mm · 2 of 22 slices shown (1 of 2)]
[im 1/22]
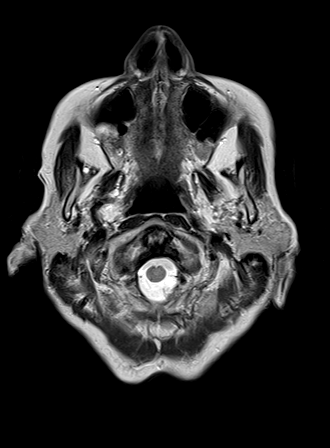
[im 22/22]
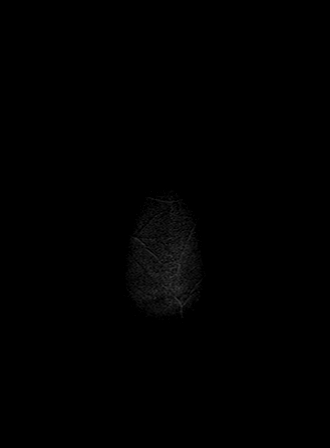

[Series 8: FLAIR · axial · 3.0mm · 0.45mm/px · z∈[-40,+93]mm · 3 of 30 slices shown]
[im 1/30]
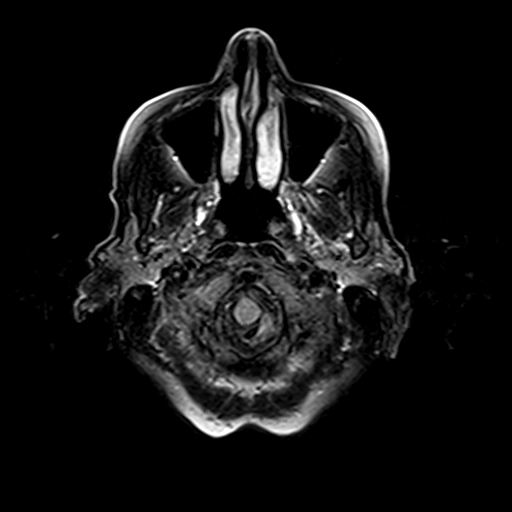
[im 15/30]
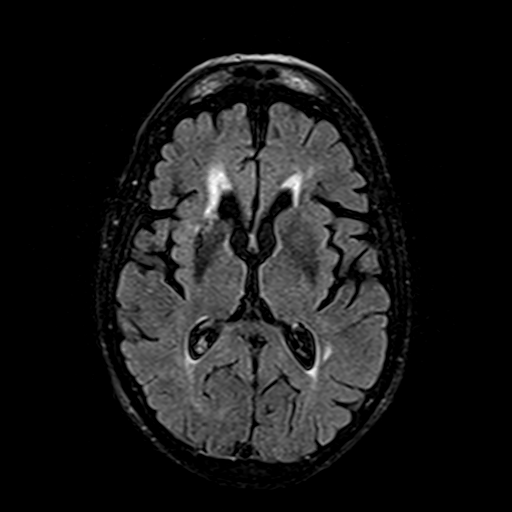
[im 30/30]
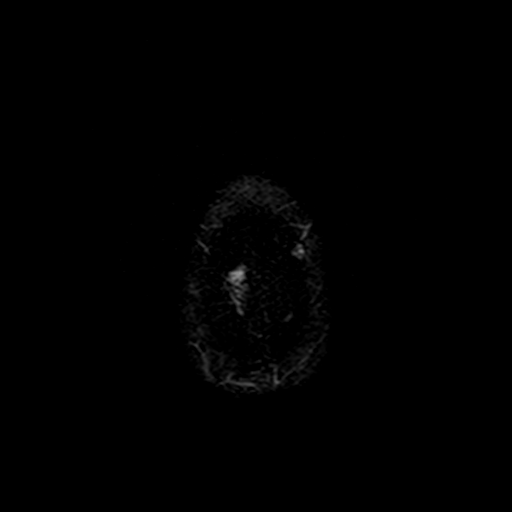

[Series 9: mip_images(sw) · axial · 32.0mm · 0.90mm/px · z∈[-29,+81]mm · 2 of 29 slices shown]
[im 1/29]
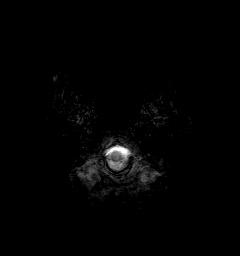
[im 29/29]
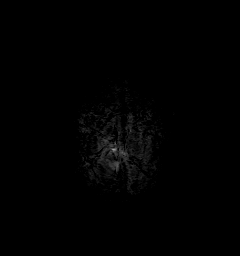

[Series 10: swi_images · axial · 4.0mm · 0.90mm/px · z∈[-43,+95]mm · 3 of 36 slices shown]
[im 1/36]
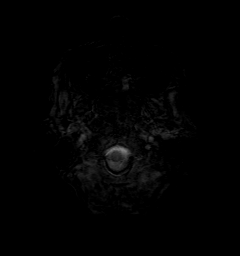
[im 18/36]
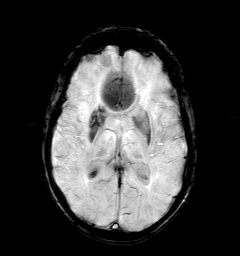
[im 36/36]
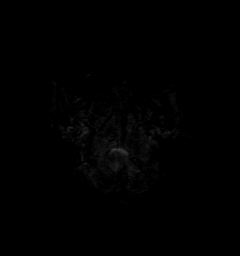

[Series 11: t1_mpr_tra · axial · 1.0mm · 0.71mm/px · z∈[-45,+96]mm · 12 of 144 slices shown]
[im 1/144]
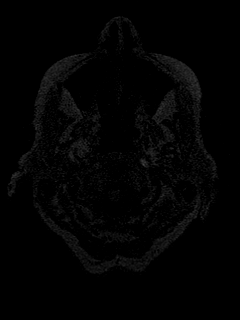
[im 14/144]
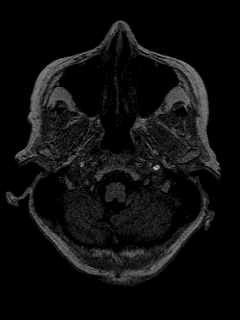
[im 27/144]
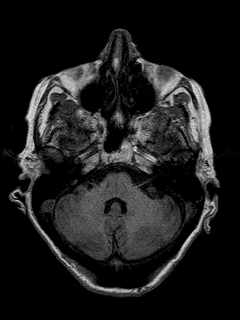
[im 40/144]
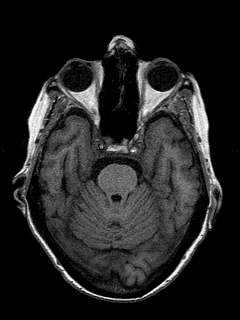
[im 53/144]
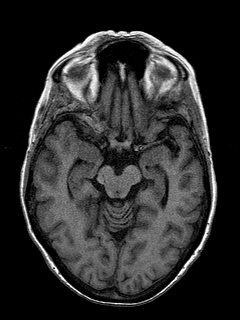
[im 66/144]
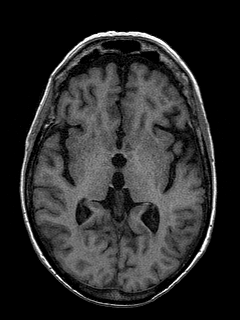
[im 79/144]
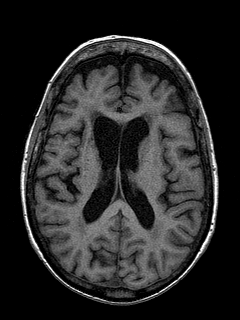
[im 92/144]
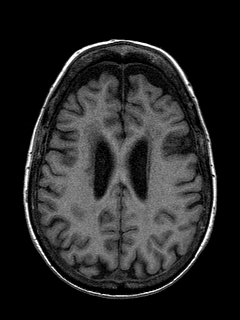
[im 105/144]
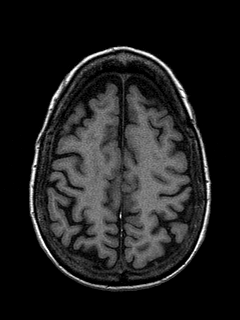
[im 118/144]
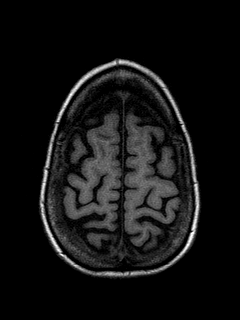
[im 131/144]
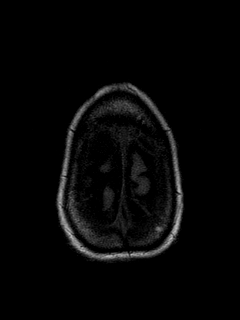
[im 144/144]
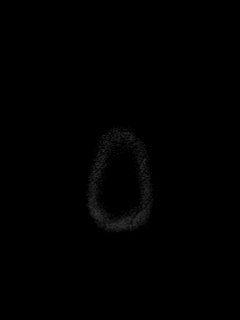

[Series 12: T2 · coronal · 5.0mm · 0.45mm/px · 2 of 27 slices shown (2 of 2)]
[im 1/27]
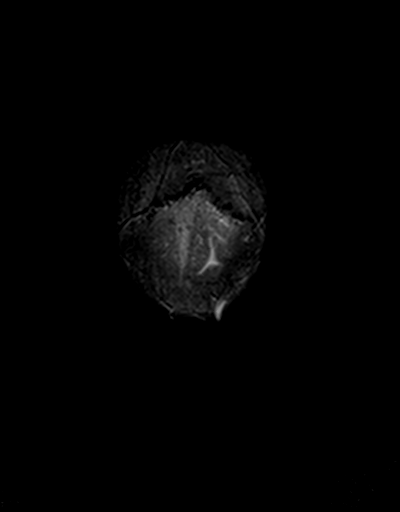
[im 27/27]
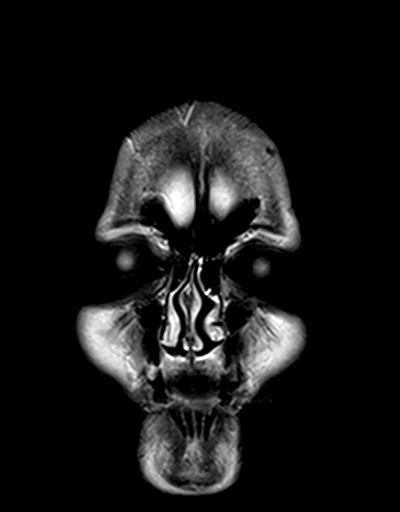

[48 of 48 positions shown; findings below may reference images not displayed]

FINDINGS: Brain: No acute or subacute infarction, hemorrhage, hydrocephalus,
extra-axial collection or mass lesion. Cerebral volume loss which is
mild for age. Remote infarcts in the inferior right cerebellum and
right basal ganglia. Mild chronic small vessel ischemic gliosis in
the hemispheric white matter.

Vascular: Major flow voids are preserved

Skull and upper cervical spine: Normal marrow signal

Sinuses/Orbits: Bilateral cataract resection
IMPRESSION: 1. No acute or subacute insult.  Stable compared to 7372.
2. Chronic small vessel ischemia with small remote infarcts in the
right basal ganglia and right cerebellum

## 2022-08-03 DIAGNOSIS — S81801A Unspecified open wound, right lower leg, initial encounter: Secondary | ICD-10-CM | POA: Diagnosis not present

## 2022-08-03 DIAGNOSIS — S81801D Unspecified open wound, right lower leg, subsequent encounter: Secondary | ICD-10-CM | POA: Diagnosis not present

## 2022-08-24 DIAGNOSIS — S81801D Unspecified open wound, right lower leg, subsequent encounter: Secondary | ICD-10-CM | POA: Diagnosis not present

## 2022-08-24 DIAGNOSIS — N189 Chronic kidney disease, unspecified: Secondary | ICD-10-CM | POA: Diagnosis not present

## 2022-08-24 DIAGNOSIS — E1122 Type 2 diabetes mellitus with diabetic chronic kidney disease: Secondary | ICD-10-CM | POA: Diagnosis not present

## 2022-08-24 DIAGNOSIS — I129 Hypertensive chronic kidney disease with stage 1 through stage 4 chronic kidney disease, or unspecified chronic kidney disease: Secondary | ICD-10-CM | POA: Diagnosis not present

## 2022-08-24 DIAGNOSIS — S81801A Unspecified open wound, right lower leg, initial encounter: Secondary | ICD-10-CM | POA: Diagnosis not present

## 2022-08-24 DIAGNOSIS — Z7984 Long term (current) use of oral hypoglycemic drugs: Secondary | ICD-10-CM | POA: Diagnosis not present

## 2022-08-25 DIAGNOSIS — F32A Depression, unspecified: Secondary | ICD-10-CM | POA: Diagnosis not present

## 2022-08-25 DIAGNOSIS — S81801A Unspecified open wound, right lower leg, initial encounter: Secondary | ICD-10-CM | POA: Diagnosis not present

## 2022-08-25 DIAGNOSIS — I129 Hypertensive chronic kidney disease with stage 1 through stage 4 chronic kidney disease, or unspecified chronic kidney disease: Secondary | ICD-10-CM | POA: Diagnosis not present

## 2022-08-25 DIAGNOSIS — E1122 Type 2 diabetes mellitus with diabetic chronic kidney disease: Secondary | ICD-10-CM | POA: Diagnosis not present

## 2022-08-25 DIAGNOSIS — Z7982 Long term (current) use of aspirin: Secondary | ICD-10-CM | POA: Diagnosis not present

## 2022-08-25 DIAGNOSIS — N189 Chronic kidney disease, unspecified: Secondary | ICD-10-CM | POA: Diagnosis not present

## 2022-08-25 DIAGNOSIS — F419 Anxiety disorder, unspecified: Secondary | ICD-10-CM | POA: Diagnosis not present

## 2022-08-25 DIAGNOSIS — Z7984 Long term (current) use of oral hypoglycemic drugs: Secondary | ICD-10-CM | POA: Diagnosis not present

## 2022-08-26 DIAGNOSIS — M1711 Unilateral primary osteoarthritis, right knee: Secondary | ICD-10-CM | POA: Diagnosis not present

## 2022-08-26 DIAGNOSIS — E1122 Type 2 diabetes mellitus with diabetic chronic kidney disease: Secondary | ICD-10-CM | POA: Diagnosis not present

## 2022-08-26 DIAGNOSIS — F332 Major depressive disorder, recurrent severe without psychotic features: Secondary | ICD-10-CM | POA: Diagnosis not present

## 2022-08-26 DIAGNOSIS — I10 Essential (primary) hypertension: Secondary | ICD-10-CM | POA: Diagnosis not present

## 2022-08-26 DIAGNOSIS — K580 Irritable bowel syndrome with diarrhea: Secondary | ICD-10-CM | POA: Diagnosis not present

## 2022-08-26 DIAGNOSIS — E440 Moderate protein-calorie malnutrition: Secondary | ICD-10-CM | POA: Diagnosis not present

## 2022-08-26 DIAGNOSIS — E7849 Other hyperlipidemia: Secondary | ICD-10-CM | POA: Diagnosis not present

## 2022-08-26 DIAGNOSIS — E039 Hypothyroidism, unspecified: Secondary | ICD-10-CM | POA: Diagnosis not present

## 2022-08-26 DIAGNOSIS — E1142 Type 2 diabetes mellitus with diabetic polyneuropathy: Secondary | ICD-10-CM | POA: Diagnosis not present

## 2022-08-26 DIAGNOSIS — E538 Deficiency of other specified B group vitamins: Secondary | ICD-10-CM | POA: Diagnosis not present

## 2022-08-26 DIAGNOSIS — E559 Vitamin D deficiency, unspecified: Secondary | ICD-10-CM | POA: Diagnosis not present

## 2022-08-26 DIAGNOSIS — F411 Generalized anxiety disorder: Secondary | ICD-10-CM | POA: Diagnosis not present

## 2022-08-27 DIAGNOSIS — J301 Allergic rhinitis due to pollen: Secondary | ICD-10-CM | POA: Diagnosis not present

## 2022-08-28 DIAGNOSIS — E1142 Type 2 diabetes mellitus with diabetic polyneuropathy: Secondary | ICD-10-CM | POA: Diagnosis not present

## 2022-08-28 DIAGNOSIS — D692 Other nonthrombocytopenic purpura: Secondary | ICD-10-CM | POA: Diagnosis not present

## 2022-08-28 DIAGNOSIS — E440 Moderate protein-calorie malnutrition: Secondary | ICD-10-CM | POA: Diagnosis not present

## 2022-08-28 DIAGNOSIS — K580 Irritable bowel syndrome with diarrhea: Secondary | ICD-10-CM | POA: Diagnosis not present

## 2022-08-28 DIAGNOSIS — F411 Generalized anxiety disorder: Secondary | ICD-10-CM | POA: Diagnosis not present

## 2022-08-28 DIAGNOSIS — E1122 Type 2 diabetes mellitus with diabetic chronic kidney disease: Secondary | ICD-10-CM | POA: Diagnosis not present

## 2022-08-28 DIAGNOSIS — E538 Deficiency of other specified B group vitamins: Secondary | ICD-10-CM | POA: Diagnosis not present

## 2022-08-28 DIAGNOSIS — R2689 Other abnormalities of gait and mobility: Secondary | ICD-10-CM | POA: Diagnosis not present

## 2022-08-28 DIAGNOSIS — I10 Essential (primary) hypertension: Secondary | ICD-10-CM | POA: Diagnosis not present

## 2022-08-28 DIAGNOSIS — E7849 Other hyperlipidemia: Secondary | ICD-10-CM | POA: Diagnosis not present

## 2022-08-28 DIAGNOSIS — E039 Hypothyroidism, unspecified: Secondary | ICD-10-CM | POA: Diagnosis not present

## 2022-08-28 DIAGNOSIS — F331 Major depressive disorder, recurrent, moderate: Secondary | ICD-10-CM | POA: Diagnosis not present

## 2022-08-29 DIAGNOSIS — E1122 Type 2 diabetes mellitus with diabetic chronic kidney disease: Secondary | ICD-10-CM | POA: Diagnosis not present

## 2022-08-29 DIAGNOSIS — N189 Chronic kidney disease, unspecified: Secondary | ICD-10-CM | POA: Diagnosis not present

## 2022-08-29 DIAGNOSIS — I129 Hypertensive chronic kidney disease with stage 1 through stage 4 chronic kidney disease, or unspecified chronic kidney disease: Secondary | ICD-10-CM | POA: Diagnosis not present

## 2022-08-29 DIAGNOSIS — S81801A Unspecified open wound, right lower leg, initial encounter: Secondary | ICD-10-CM | POA: Diagnosis not present

## 2022-08-29 DIAGNOSIS — F419 Anxiety disorder, unspecified: Secondary | ICD-10-CM | POA: Diagnosis not present

## 2022-08-29 DIAGNOSIS — F32A Depression, unspecified: Secondary | ICD-10-CM | POA: Diagnosis not present

## 2022-08-31 DIAGNOSIS — E1122 Type 2 diabetes mellitus with diabetic chronic kidney disease: Secondary | ICD-10-CM | POA: Diagnosis not present

## 2022-08-31 DIAGNOSIS — F32A Depression, unspecified: Secondary | ICD-10-CM | POA: Diagnosis not present

## 2022-08-31 DIAGNOSIS — I129 Hypertensive chronic kidney disease with stage 1 through stage 4 chronic kidney disease, or unspecified chronic kidney disease: Secondary | ICD-10-CM | POA: Diagnosis not present

## 2022-08-31 DIAGNOSIS — S81801A Unspecified open wound, right lower leg, initial encounter: Secondary | ICD-10-CM | POA: Diagnosis not present

## 2022-08-31 DIAGNOSIS — N189 Chronic kidney disease, unspecified: Secondary | ICD-10-CM | POA: Diagnosis not present

## 2022-08-31 DIAGNOSIS — F419 Anxiety disorder, unspecified: Secondary | ICD-10-CM | POA: Diagnosis not present

## 2022-09-02 DIAGNOSIS — E1122 Type 2 diabetes mellitus with diabetic chronic kidney disease: Secondary | ICD-10-CM | POA: Diagnosis not present

## 2022-09-02 DIAGNOSIS — I129 Hypertensive chronic kidney disease with stage 1 through stage 4 chronic kidney disease, or unspecified chronic kidney disease: Secondary | ICD-10-CM | POA: Diagnosis not present

## 2022-09-02 DIAGNOSIS — F32A Depression, unspecified: Secondary | ICD-10-CM | POA: Diagnosis not present

## 2022-09-02 DIAGNOSIS — F419 Anxiety disorder, unspecified: Secondary | ICD-10-CM | POA: Diagnosis not present

## 2022-09-02 DIAGNOSIS — S81801A Unspecified open wound, right lower leg, initial encounter: Secondary | ICD-10-CM | POA: Diagnosis not present

## 2022-09-02 DIAGNOSIS — N189 Chronic kidney disease, unspecified: Secondary | ICD-10-CM | POA: Diagnosis not present

## 2022-09-05 DIAGNOSIS — S81801A Unspecified open wound, right lower leg, initial encounter: Secondary | ICD-10-CM | POA: Diagnosis not present

## 2022-09-05 DIAGNOSIS — F419 Anxiety disorder, unspecified: Secondary | ICD-10-CM | POA: Diagnosis not present

## 2022-09-05 DIAGNOSIS — N189 Chronic kidney disease, unspecified: Secondary | ICD-10-CM | POA: Diagnosis not present

## 2022-09-05 DIAGNOSIS — E1122 Type 2 diabetes mellitus with diabetic chronic kidney disease: Secondary | ICD-10-CM | POA: Diagnosis not present

## 2022-09-05 DIAGNOSIS — F32A Depression, unspecified: Secondary | ICD-10-CM | POA: Diagnosis not present

## 2022-09-05 DIAGNOSIS — I129 Hypertensive chronic kidney disease with stage 1 through stage 4 chronic kidney disease, or unspecified chronic kidney disease: Secondary | ICD-10-CM | POA: Diagnosis not present

## 2022-09-06 DIAGNOSIS — D519 Vitamin B12 deficiency anemia, unspecified: Secondary | ICD-10-CM | POA: Diagnosis not present

## 2022-09-06 DIAGNOSIS — E559 Vitamin D deficiency, unspecified: Secondary | ICD-10-CM | POA: Diagnosis not present

## 2022-09-06 DIAGNOSIS — D649 Anemia, unspecified: Secondary | ICD-10-CM | POA: Diagnosis not present

## 2022-09-06 DIAGNOSIS — E1165 Type 2 diabetes mellitus with hyperglycemia: Secondary | ICD-10-CM | POA: Diagnosis not present

## 2022-09-06 DIAGNOSIS — N1831 Chronic kidney disease, stage 3a: Secondary | ICD-10-CM | POA: Diagnosis not present

## 2022-09-06 DIAGNOSIS — E039 Hypothyroidism, unspecified: Secondary | ICD-10-CM | POA: Diagnosis not present

## 2022-09-06 DIAGNOSIS — E538 Deficiency of other specified B group vitamins: Secondary | ICD-10-CM | POA: Diagnosis not present

## 2022-09-06 DIAGNOSIS — R5383 Other fatigue: Secondary | ICD-10-CM | POA: Diagnosis not present

## 2022-09-06 DIAGNOSIS — D529 Folate deficiency anemia, unspecified: Secondary | ICD-10-CM | POA: Diagnosis not present

## 2022-09-06 DIAGNOSIS — E7849 Other hyperlipidemia: Secondary | ICD-10-CM | POA: Diagnosis not present

## 2022-09-08 DIAGNOSIS — I129 Hypertensive chronic kidney disease with stage 1 through stage 4 chronic kidney disease, or unspecified chronic kidney disease: Secondary | ICD-10-CM | POA: Diagnosis not present

## 2022-09-08 DIAGNOSIS — F419 Anxiety disorder, unspecified: Secondary | ICD-10-CM | POA: Diagnosis not present

## 2022-09-08 DIAGNOSIS — N189 Chronic kidney disease, unspecified: Secondary | ICD-10-CM | POA: Diagnosis not present

## 2022-09-08 DIAGNOSIS — S81801A Unspecified open wound, right lower leg, initial encounter: Secondary | ICD-10-CM | POA: Diagnosis not present

## 2022-09-08 DIAGNOSIS — F32A Depression, unspecified: Secondary | ICD-10-CM | POA: Diagnosis not present

## 2022-09-08 DIAGNOSIS — E1122 Type 2 diabetes mellitus with diabetic chronic kidney disease: Secondary | ICD-10-CM | POA: Diagnosis not present

## 2022-09-13 DIAGNOSIS — F411 Generalized anxiety disorder: Secondary | ICD-10-CM | POA: Diagnosis not present

## 2022-09-13 DIAGNOSIS — S81801A Unspecified open wound, right lower leg, initial encounter: Secondary | ICD-10-CM | POA: Diagnosis not present

## 2022-09-13 DIAGNOSIS — E440 Moderate protein-calorie malnutrition: Secondary | ICD-10-CM | POA: Diagnosis not present

## 2022-09-13 DIAGNOSIS — F332 Major depressive disorder, recurrent severe without psychotic features: Secondary | ICD-10-CM | POA: Diagnosis not present

## 2022-09-13 DIAGNOSIS — N189 Chronic kidney disease, unspecified: Secondary | ICD-10-CM | POA: Diagnosis not present

## 2022-09-13 DIAGNOSIS — E1122 Type 2 diabetes mellitus with diabetic chronic kidney disease: Secondary | ICD-10-CM | POA: Diagnosis not present

## 2022-09-13 DIAGNOSIS — Z0001 Encounter for general adult medical examination with abnormal findings: Secondary | ICD-10-CM | POA: Diagnosis not present

## 2022-09-13 DIAGNOSIS — I10 Essential (primary) hypertension: Secondary | ICD-10-CM | POA: Diagnosis not present

## 2022-09-13 DIAGNOSIS — I129 Hypertensive chronic kidney disease with stage 1 through stage 4 chronic kidney disease, or unspecified chronic kidney disease: Secondary | ICD-10-CM | POA: Diagnosis not present

## 2022-09-13 DIAGNOSIS — R4582 Worries: Secondary | ICD-10-CM | POA: Diagnosis not present

## 2022-09-13 DIAGNOSIS — F419 Anxiety disorder, unspecified: Secondary | ICD-10-CM | POA: Diagnosis not present

## 2022-09-13 DIAGNOSIS — E1142 Type 2 diabetes mellitus with diabetic polyneuropathy: Secondary | ICD-10-CM | POA: Diagnosis not present

## 2022-09-13 DIAGNOSIS — E7849 Other hyperlipidemia: Secondary | ICD-10-CM | POA: Diagnosis not present

## 2022-09-13 DIAGNOSIS — F32A Depression, unspecified: Secondary | ICD-10-CM | POA: Diagnosis not present

## 2022-09-13 DIAGNOSIS — Z23 Encounter for immunization: Secondary | ICD-10-CM | POA: Diagnosis not present

## 2022-09-13 DIAGNOSIS — K580 Irritable bowel syndrome with diarrhea: Secondary | ICD-10-CM | POA: Diagnosis not present

## 2022-09-13 DIAGNOSIS — E538 Deficiency of other specified B group vitamins: Secondary | ICD-10-CM | POA: Diagnosis not present

## 2022-09-14 DIAGNOSIS — S81801D Unspecified open wound, right lower leg, subsequent encounter: Secondary | ICD-10-CM | POA: Diagnosis not present

## 2022-09-14 DIAGNOSIS — S81801A Unspecified open wound, right lower leg, initial encounter: Secondary | ICD-10-CM | POA: Diagnosis not present

## 2022-09-14 DIAGNOSIS — I129 Hypertensive chronic kidney disease with stage 1 through stage 4 chronic kidney disease, or unspecified chronic kidney disease: Secondary | ICD-10-CM | POA: Diagnosis not present

## 2022-09-14 DIAGNOSIS — N189 Chronic kidney disease, unspecified: Secondary | ICD-10-CM | POA: Diagnosis not present

## 2022-09-14 DIAGNOSIS — E1122 Type 2 diabetes mellitus with diabetic chronic kidney disease: Secondary | ICD-10-CM | POA: Diagnosis not present

## 2022-09-14 DIAGNOSIS — Z7984 Long term (current) use of oral hypoglycemic drugs: Secondary | ICD-10-CM | POA: Diagnosis not present

## 2022-09-15 DIAGNOSIS — F419 Anxiety disorder, unspecified: Secondary | ICD-10-CM | POA: Diagnosis not present

## 2022-09-15 DIAGNOSIS — I129 Hypertensive chronic kidney disease with stage 1 through stage 4 chronic kidney disease, or unspecified chronic kidney disease: Secondary | ICD-10-CM | POA: Diagnosis not present

## 2022-09-15 DIAGNOSIS — S81801A Unspecified open wound, right lower leg, initial encounter: Secondary | ICD-10-CM | POA: Diagnosis not present

## 2022-09-15 DIAGNOSIS — E1122 Type 2 diabetes mellitus with diabetic chronic kidney disease: Secondary | ICD-10-CM | POA: Diagnosis not present

## 2022-09-15 DIAGNOSIS — F32A Depression, unspecified: Secondary | ICD-10-CM | POA: Diagnosis not present

## 2022-09-15 DIAGNOSIS — N189 Chronic kidney disease, unspecified: Secondary | ICD-10-CM | POA: Diagnosis not present

## 2022-09-22 DIAGNOSIS — L84 Corns and callosities: Secondary | ICD-10-CM | POA: Diagnosis not present

## 2022-09-22 DIAGNOSIS — M79676 Pain in unspecified toe(s): Secondary | ICD-10-CM | POA: Diagnosis not present

## 2022-09-22 DIAGNOSIS — E1142 Type 2 diabetes mellitus with diabetic polyneuropathy: Secondary | ICD-10-CM | POA: Diagnosis not present

## 2022-09-22 DIAGNOSIS — B351 Tinea unguium: Secondary | ICD-10-CM | POA: Diagnosis not present

## 2022-09-23 DIAGNOSIS — D692 Other nonthrombocytopenic purpura: Secondary | ICD-10-CM | POA: Diagnosis not present

## 2022-09-23 DIAGNOSIS — E1122 Type 2 diabetes mellitus with diabetic chronic kidney disease: Secondary | ICD-10-CM | POA: Diagnosis not present

## 2022-09-23 DIAGNOSIS — E1142 Type 2 diabetes mellitus with diabetic polyneuropathy: Secondary | ICD-10-CM | POA: Diagnosis not present

## 2022-09-23 DIAGNOSIS — E538 Deficiency of other specified B group vitamins: Secondary | ICD-10-CM | POA: Diagnosis not present

## 2022-09-23 DIAGNOSIS — F411 Generalized anxiety disorder: Secondary | ICD-10-CM | POA: Diagnosis not present

## 2022-09-23 DIAGNOSIS — I10 Essential (primary) hypertension: Secondary | ICD-10-CM | POA: Diagnosis not present

## 2022-09-23 DIAGNOSIS — E440 Moderate protein-calorie malnutrition: Secondary | ICD-10-CM | POA: Diagnosis not present

## 2022-09-23 DIAGNOSIS — F332 Major depressive disorder, recurrent severe without psychotic features: Secondary | ICD-10-CM | POA: Diagnosis not present

## 2022-09-23 DIAGNOSIS — K580 Irritable bowel syndrome with diarrhea: Secondary | ICD-10-CM | POA: Diagnosis not present

## 2022-09-23 DIAGNOSIS — E7849 Other hyperlipidemia: Secondary | ICD-10-CM | POA: Diagnosis not present

## 2022-09-23 DIAGNOSIS — F331 Major depressive disorder, recurrent, moderate: Secondary | ICD-10-CM | POA: Diagnosis not present

## 2022-09-23 DIAGNOSIS — E039 Hypothyroidism, unspecified: Secondary | ICD-10-CM | POA: Diagnosis not present

## 2022-09-29 DIAGNOSIS — H6123 Impacted cerumen, bilateral: Secondary | ICD-10-CM | POA: Diagnosis not present

## 2022-10-13 DIAGNOSIS — I10 Essential (primary) hypertension: Secondary | ICD-10-CM | POA: Diagnosis not present

## 2022-10-13 DIAGNOSIS — E039 Hypothyroidism, unspecified: Secondary | ICD-10-CM | POA: Diagnosis not present

## 2022-10-13 DIAGNOSIS — F411 Generalized anxiety disorder: Secondary | ICD-10-CM | POA: Diagnosis not present

## 2022-10-13 DIAGNOSIS — E538 Deficiency of other specified B group vitamins: Secondary | ICD-10-CM | POA: Diagnosis not present

## 2022-10-13 DIAGNOSIS — R2689 Other abnormalities of gait and mobility: Secondary | ICD-10-CM | POA: Diagnosis not present

## 2022-10-13 DIAGNOSIS — E1122 Type 2 diabetes mellitus with diabetic chronic kidney disease: Secondary | ICD-10-CM | POA: Diagnosis not present

## 2022-10-13 DIAGNOSIS — K580 Irritable bowel syndrome with diarrhea: Secondary | ICD-10-CM | POA: Diagnosis not present

## 2022-10-13 DIAGNOSIS — E440 Moderate protein-calorie malnutrition: Secondary | ICD-10-CM | POA: Diagnosis not present

## 2022-10-13 DIAGNOSIS — F332 Major depressive disorder, recurrent severe without psychotic features: Secondary | ICD-10-CM | POA: Diagnosis not present

## 2022-10-13 DIAGNOSIS — D692 Other nonthrombocytopenic purpura: Secondary | ICD-10-CM | POA: Diagnosis not present

## 2022-10-13 DIAGNOSIS — E7849 Other hyperlipidemia: Secondary | ICD-10-CM | POA: Diagnosis not present

## 2022-10-13 DIAGNOSIS — E1142 Type 2 diabetes mellitus with diabetic polyneuropathy: Secondary | ICD-10-CM | POA: Diagnosis not present

## 2022-10-22 DIAGNOSIS — R634 Abnormal weight loss: Secondary | ICD-10-CM | POA: Diagnosis not present

## 2022-10-22 DIAGNOSIS — E440 Moderate protein-calorie malnutrition: Secondary | ICD-10-CM | POA: Diagnosis not present

## 2022-10-22 DIAGNOSIS — Z681 Body mass index (BMI) 19 or less, adult: Secondary | ICD-10-CM | POA: Diagnosis not present

## 2022-11-04 DIAGNOSIS — I10 Essential (primary) hypertension: Secondary | ICD-10-CM | POA: Diagnosis not present

## 2022-11-04 DIAGNOSIS — R030 Elevated blood-pressure reading, without diagnosis of hypertension: Secondary | ICD-10-CM | POA: Diagnosis not present

## 2022-11-04 DIAGNOSIS — N1831 Chronic kidney disease, stage 3a: Secondary | ICD-10-CM | POA: Diagnosis not present

## 2022-11-04 DIAGNOSIS — E039 Hypothyroidism, unspecified: Secondary | ICD-10-CM | POA: Diagnosis not present

## 2022-11-08 ENCOUNTER — Encounter (INDEPENDENT_AMBULATORY_CARE_PROVIDER_SITE_OTHER): Payer: Self-pay | Admitting: *Deleted

## 2022-11-09 ENCOUNTER — Encounter (INDEPENDENT_AMBULATORY_CARE_PROVIDER_SITE_OTHER): Payer: Self-pay | Admitting: Gastroenterology

## 2022-11-09 ENCOUNTER — Ambulatory Visit (INDEPENDENT_AMBULATORY_CARE_PROVIDER_SITE_OTHER): Payer: Medicare Other | Admitting: Gastroenterology

## 2022-11-09 ENCOUNTER — Encounter (INDEPENDENT_AMBULATORY_CARE_PROVIDER_SITE_OTHER): Payer: Self-pay

## 2022-11-09 ENCOUNTER — Telehealth (INDEPENDENT_AMBULATORY_CARE_PROVIDER_SITE_OTHER): Payer: Self-pay | Admitting: Gastroenterology

## 2022-11-09 VITALS — BP 123/75 | HR 106 | Temp 97.1°F | Ht 65.0 in | Wt 85.4 lb

## 2022-11-09 DIAGNOSIS — K52832 Lymphocytic colitis: Secondary | ICD-10-CM | POA: Insufficient documentation

## 2022-11-09 DIAGNOSIS — R636 Underweight: Secondary | ICD-10-CM | POA: Insufficient documentation

## 2022-11-09 DIAGNOSIS — R1310 Dysphagia, unspecified: Secondary | ICD-10-CM

## 2022-11-09 MED ORDER — PANTOPRAZOLE SODIUM 40 MG PO TBEC: 40.0000 mg | DELAYED_RELEASE_TABLET | Freq: Every day | ORAL | 3 refills | Status: DC

## 2022-11-09 NOTE — Patient Instructions (Signed)
It was very nice to meet you today, as dicussed with will plan for the following :  1) Modified barium swallow 2) Upper endoscopy

## 2022-11-09 NOTE — Progress Notes (Signed)
[name] , M.D. Gastroenterology & Hepatology [facility] [address] [place], [place] [address] Primary Care Physician: Richardean Chimera, MD [address] [place] [address]  Chief Complaint:  Dysphagia , Microscopic colitis   History of Present Illness: Ellen Cruz is a 82 y.o. female with history of CVA , hypothyroidism , lymphocytic colitis on budesonide since 2019 who presents for evaluation of solid and liquid food dysphagia .  Patient reports that dysphagia has been going on for many years but recently has been getting worse.  Reports that she has difficulty swallowing both liquids and solids.  She mostly have problems swallowing bread to the point that patient has many episodes of choking where she is unable to breathe requiring manual tapping at the back and recent Heimlich maneuver at a restaurant .  Patient has almost every day retrosternal chest pain while swallowing and intermittent undigested food regurgitation.  She remains underweight because she does not have much of appetite  Patient reports that she has been on budesonide after being diagnosed with microscopic colitis since 2019 and has been taking since that.  She is having 2-3 semiformed bowel movements daily and is hesitant to discontinue budesonide at this time.  Patient was previously seen by GI where she had an upper endoscopy in 2020(no esophageal biopsies reported) colonoscopy done in 2019 with biopsies positive for lymphocytic colitis and patient has been on budesonide since  Last EGD:2020  - Normal larynx. - Normal esophagus. No findings amenable to dilation. - Normal stomach. - Normal examined duodenum. - No specimens collected.  Presbyesophagus, primarily affecting UES. Last Colonoscopy:2019  - The examined portion of the ileum was normal. - Diverticulosis in the left colon. - Normal mucosa in the entire examined colon. Biopsied. - The examination  was otherwise normal on direct and retroflexion views.  FHx: neg for any gastrointestinal/liver disease, no malignancies Social: neg smoking, alcohol or illicit drug use Surgical: cholecystectomy ,appendectomy  Past Medical History: Past Medical History:  Diagnosis Date   Anxiety    At risk for falling    has cane   Breast nodule    Cataract    bilateral cataracts removed   Chronic kidney disease    stage 3/  per pt   COVID    feb. 2022   Depression    Diabetes mellitus without complication (HCC)    no meds/controlled by diet   Diarrhea    GERD (gastroesophageal reflux disease)    HTN (hypertension)    Microscopic colitis    Stroke (HCC)    x 2   Thyroid disease     Past Surgical History: Past Surgical History:  Procedure Laterality Date   APPENDECTOMY     breast nodule surgery     carpel tunnel     CATARACT EXTRACTION     CHOLECYSTECTOMY     COLONOSCOPY     HEMORRHOID SURGERY     3 times   TONSILLECTOMY     UPPER GASTROINTESTINAL ENDOSCOPY     VAGINAL HYSTERECTOMY      Family History: Family History  Problem Relation Age of Onset   Renal Disease Mother    Colon cancer Father        does not know how old he was when dx - lived for 40 years after sx   Stomach cancer Neg Hx    Pancreatic cancer Neg Hx    Esophageal cancer Neg Hx    Prostate  cancer Neg Hx    Rectal cancer Neg Hx     Social History: Social History   Tobacco Use  Smoking Status Never  Smokeless Tobacco Never   Social History   Substance and Sexual Activity  Alcohol Use Never   Social History   Substance and Sexual Activity  Drug Use Never    Allergies: Allergies  Allergen Reactions   Glycopyrrolate Other (See Comments)    "cotton mouth" and blurred vision   Asa [Aspirin] Other (See Comments)    Dr. Fausto Skillern took pt off ASA, Tylenol d/t pt's mother having kidney failure   Codeine Nausea Only   Lactose Intolerance (Gi)     Causes diarrhea   Lexapro [Escitalopram Oxalate]  Palpitations   Phenothiazines Rash   Statins Other (See Comments)    Muscle aches   Sulfa Antibiotics Nausea Only   Zyrtec [Cetirizine] Rash    Medications: Current Outpatient Medications  Medication Sig Dispense Refill   budesonide (ENTOCORT EC) 3 MG 24 hr capsule Take 1 capsule (3 mg total) by mouth daily. 90 capsule 1   Cholecalciferol (VITAMIN D3) 25 MCG (1000 UT) CAPS Take 1 capsule by mouth daily.     diazepam (VALIUM) 10 MG tablet Take 10 mg by mouth 3 (three) times daily as needed for anxiety.     Fexofenadine-Pseudoephedrine (ALLEGRA-D 24 HOUR PO) Take 1 tablet by mouth daily.     guaifenesin (HUMIBID E) 400 MG TABS tablet Take 400 mg by mouth as needed.     hydroxypropyl methylcellulose / hypromellose (ISOPTO TEARS / GONIOVISC) 2.5 % ophthalmic solution Place 1 drop into both eyes as needed for dry eyes.     levothyroxine (SYNTHROID) 50 MCG tablet Take 50 mcg by mouth daily.     meclizine (ANTIVERT) 12.5 MG tablet Take 12.5 mg by mouth 3 (three) times daily as needed for dizziness.     OLANZapine (ZYPREXA) 2.5 MG tablet Take 2.5 mg by mouth at bedtime.     pantoprazole (PROTONIX) 40 MG tablet Take 40 mg by mouth as needed.     pantoprazole (PROTONIX) 40 MG tablet Take 1 tablet (40 mg total) by mouth daily. 60 tablet 3   prednisoLONE 5 MG TABS tablet Take by mouth.     Rosuvastatin Calcium 20 MG CPSP Take 1 tablet by mouth once a week.     aspirin 81 MG EC tablet Take 81 mg by mouth daily. Swallow whole. (Patient not taking: Reported on 11/09/2022)     furosemide (LASIX) 40 MG tablet Take 40 mg by mouth daily. (Patient not taking: Reported on 11/09/2022)     No current facility-administered medications for this visit.    Review of Systems: GENERAL: negative for malaise, night sweats HEENT: No changes in hearing or vision, no nose bleeds or other nasal problems. NECK: Negative for lumps, goiter, pain and significant neck swelling RESPIRATORY: Negative for cough,  wheezing CARDIOVASCULAR: Negative for chest pain, leg swelling, palpitations, orthopnea GI: SEE HPI MUSCULOSKELETAL: Negative for joint pain or swelling, back pain, and muscle pain. SKIN: Negative for lesions, rash HEMATOLOGY Negative for prolonged bleeding, bruising easily, and swollen nodes. ENDOCRINE: Negative for cold or heat intolerance, polyuria, polydipsia and goiter. NEURO: negative for tremor, gait imbalance, syncope and seizures. The remainder of the review of systems is noncontributory.   Physical Exam: BP 123/75 (BP Location: Left Arm, Patient Position: Sitting, Cuff Size: Normal)   Pulse (!) 106   Temp (!) 97.1 F (36.2 C) (Temporal)   Ht 5'  5" (1.651 m)   Wt 85 lb 6.4 oz (38.7 kg)   BMI 14.21 kg/m  GENERAL: The patient is AO x3, in no acute distress.elderly and frail appearing  HEENT: Head is normocephalic and atraumatic. EOMI are intact. Mouth is well hydrated and without lesions. NECK: Supple. No masses LUNGS: Clear to auscultation. No presence of rhonchi/wheezing/rales. Adequate chest expansion HEART: RRR, normal s1 and s2. ABDOMEN: Soft, nontender, no guarding, no peritoneal signs, and nondistended. BS +. No masses. EXTREMITIES: Without any cyanosis, clubbing, rash, lesions or edema. NEUROLOGIC: AOx3, no focal motor deficit. SKIN: no jaundice, no rashes   Imaging/Labs: as above  I personally reviewed and interpreted the available labs, imaging and endoscopic files.   Normal liver enzymes HBG: 13  Impression and Plan:  Ellen Cruz is a 82 y.o. female with history of CVA , hypothyroidism , lymphocytic colitis on budesonide since 2019 who presents for evaluation of solid and liquid food dysphagia .  #Progressive Liquid and Solid food dysphagia   Patient reports that dysphagia has been going on for many years but recently has been getting worse.  Reports that she has difficulty swallowing both liquids and solids.  patient has many episodes of  choking where she is unable to breathe requiring manual tapping at the back and recent Heimlich maneuver at a restaurant .  Patient has almost every day retrosternal chest pain while swallowing and intermittent undigested food regurgitation.  She remains underweight because she does not have much of appetite  Patient was previously seen by GI where she had an upper endoscopy in 2020(no esophageal biopsies reported), need to r/o EOE   Patient also has history of stroke hence modified barium swallow is indicated to assess oropharyngeal dysphagia  Recs: -EGD with esophageal biopsies +/- dilation  -Modified barium swallow to assess oropharyngeal dysphagia given history of stroke -Aspiration precautions - Cut food in small pieces and chew food thoroughly to avoid regurgitation episodes. - Discussed avoidance of eating within 3 hours of lying down to sleep and benefit of blocks to elevate head of bed. Also, will benefit from avoiding carbonated drinks/sodas or food that has tomatoes, spicy or greasy food. -Can take Protonix for now 30 minutes before breakfast, even though it is implicated microscopic colitis.  Given episodes of choking we will attempt to give all the treatment options available. -Patient Understands that given her advanced age and comorbidities she has high risk of adverse event for endoscopy; verbalizes understanding and want to proceed with upper endoscopy  #Lymphocytic colitis   Patient reports that she has been on budesonide after being diagnosed with microscopic colitis since 2019 and has been taking since that.  She is having 2-3 semiformed bowel movements daily and is hesitant to discontinue budesonide at this time.  colonoscopy done in 2019 with biopsies positive for lymphocytic colitis and patient has been on budesonide since  Advised patient to taper budesonide and we will reassess response  #Underweight   Patient has a BMI if 14 . She reports fear of eating because of  choking episodes . Advised to drink protein shakes. We will proceed with plan as above  All questions were answered.      [name], MD Gastroenterology and Hepatology [facility] Gastroenterology

## 2022-11-09 NOTE — Telephone Encounter (Signed)
Melanie(Endo) called and asked why Dr.Ahmed had 2 pts in the morning on 11/18/22 and then a 1:15pm. Told Melanie that pt was only available in afternoons and Shawna Orleans states that Cleo may not let that happen and that it would only be one hour worth of work until that afternoon.  Contacted pt and asked pt if she could reschedule. Pt was hesitant due to husband having appointments at [facility]. Pt agreed on 11/24/22 at 1:15pm. PT CAN NOT COME IN MORNINGS DUE TO BOWELS PER PATIENT.

## 2022-11-10 ENCOUNTER — Other Ambulatory Visit (HOSPITAL_COMMUNITY): Payer: Self-pay | Admitting: Occupational Therapy

## 2022-11-10 DIAGNOSIS — R1312 Dysphagia, oropharyngeal phase: Secondary | ICD-10-CM

## 2022-11-10 DIAGNOSIS — K219 Gastro-esophageal reflux disease without esophagitis: Secondary | ICD-10-CM

## 2022-11-11 NOTE — Patient Instructions (Signed)
Ellen Cruz  11/11/2022     @PREFPERIOPPHARMACY @   Your procedure is scheduled on  11/24/2022.   Report to [facility] at  1120  A.M.   Call this number if you have problems the morning of surgery:  [phone]  If you experience any cold or flu symptoms such as cough, fever, chills, shortness of breath, etc. between now and your scheduled surgery, please notify us at the above number.   Remember:  Follow the diet instructions given to you by the office.     Take these medicines the morning of surgery with A SIP OF WATER          budesonide, diazepam, levothyroxine, meclizine(if needed), pantoprazole.     Do not wear jewelry, make-up or nail polish, including gel polish,  artificial nails, or any other type of covering on natural nails (fingers and  toes).  Do not wear lotions, powders, or perfumes, or deodorant.  Do not shave 48 hours prior to surgery.  Men may shave face and neck.  Do not bring valuables to the hospital.  [facility] is not responsible for any belongings or valuables.  Contacts, dentures or bridgework may not be worn into surgery.  Leave your suitcase in the car.  After surgery it may be brought to your room.  For patients admitted to the hospital, discharge time will be determined by your treatment team.  Patients discharged the day of surgery will not be allowed to drive home and must have someone with them for 24 hours.    Special instructions:   DO NOT smoke tobacco or vape for 24 hours before your procedure.  Please read over the following fact sheets that you were given. Anesthesia Post-op Instructions and Care and Recovery After Surgery      Upper Endoscopy, Adult, Care After After the procedure, it is common to have a sore throat. It is also common to have: Mild stomach pain or discomfort. Bloating. Nausea. Follow these instructions at home: The instructions below may help you care for yourself at home. Your health care  provider may give you more instructions. If you have questions, ask your health care provider. If you were given a sedative during the procedure, it can affect you for several hours. Do not drive or operate machinery until your health care provider says that it is safe. If you will be going home right after the procedure, plan to have a responsible adult: Take you home from the hospital or clinic. You will not be allowed to drive. Care for you for the time you are told. Follow instructions from your health care provider about what you may eat and drink. Return to your normal activities as told by your health care provider. Ask your health care provider what activities are safe for you. Take over-the-counter and prescription medicines only as told by your health care provider. Contact a health care provider if you: Have a sore throat that lasts longer than one day. Have trouble swallowing. Have a fever. Get help right away if you: Vomit blood or your vomit looks like coffee grounds. Have bloody, black, or tarry stools. Have a very bad sore throat or you cannot swallow. Have difficulty breathing or very bad pain in your chest or abdomen. These symptoms may be an emergency. Get help right away. Call 911. Do not wait to see if the symptoms will go away. Do not drive yourself to the hospital. Summary After the  procedure, it is common to have a sore throat, mild stomach discomfort, bloating, and nausea. If you were given a sedative during the procedure, it can affect you for several hours. Do not drive until your health care provider says that it is safe. Follow instructions from your health care provider about what you may eat and drink. Return to your normal activities as told by your health care provider. This information is not intended to replace advice given to you by your health care provider. Make sure you discuss any questions you have with your health care provider. Document Revised:  07/14/2021 Document Reviewed: 07/14/2021 Elsevier Patient Education  2024 [employer] Monitored Anesthesia Care, Care After The following information offers guidance on how to care for yourself after your procedure. Your health care provider may also give you more specific instructions. If you have problems or questions, contact your health care provider. What can I expect after the procedure? After the procedure, it is common to have: Tiredness. Little or no memory about what happened during or after the procedure. Impaired judgment when it comes to making decisions. Nausea or vomiting. Some trouble with balance. Follow these instructions at home: For the time period you were told by your health care provider:  Rest. Do not participate in activities where you could fall or become injured. Do not drive or use machinery. Do not drink alcohol. Do not take sleeping pills or medicines that cause drowsiness. Do not make important decisions or sign legal documents. Do not take care of children on your own. Medicines Take over-the-counter and prescription medicines only as told by your health care provider. If you were prescribed antibiotics, take them as told by your health care provider. Do not stop using the antibiotic even if you start to feel better. Eating and drinking Follow instructions from your health care provider about what you may eat and drink. Drink enough fluid to keep your urine pale yellow. If you vomit: Drink clear fluids slowly and in small amounts as you are able. Clear fluids include water, ice chips, low-calorie sports drinks, and fruit juice that has water added to it (diluted fruit juice). Eat light and bland foods in small amounts as you are able. These foods include bananas, applesauce, rice, lean meats, toast, and crackers. General instructions  Have a responsible adult stay with you for the time you are told. It is important to have someone help care for you  until you are awake and alert. If you have sleep apnea, surgery and some medicines can increase your risk for breathing problems. Follow instructions from your health care provider about wearing your sleep device: When you are sleeping. This includes during daytime naps. While taking prescription pain medicines, sleeping medicines, or medicines that make you drowsy. Do not use any products that contain nicotine or tobacco. These products include cigarettes, chewing tobacco, and vaping devices, such as e-cigarettes. If you need help quitting, ask your health care provider. Contact a health care provider if: You feel nauseous or vomit every time you eat or drink. You feel light-headed. You are still sleepy or having trouble with balance after 24 hours. You get a rash. You have a fever. You have redness or swelling around the IV site. Get help right away if: You have trouble breathing. You have new confusion after you get home. These symptoms may be an emergency. Get help right away. Call 911. Do not wait to see if the symptoms will go away. Do not drive yourself  to the hospital. This information is not intended to replace advice given to you by your health care provider. Make sure you discuss any questions you have with your health care provider. Document Revised: 08/30/2021 Document Reviewed: 08/30/2021 Elsevier Patient Education  2024 [employer].

## 2022-11-14 ENCOUNTER — Other Ambulatory Visit: Payer: Self-pay

## 2022-11-14 ENCOUNTER — Encounter (HOSPITAL_COMMUNITY): Payer: Self-pay | Admitting: Speech Pathology

## 2022-11-14 ENCOUNTER — Encounter (HOSPITAL_COMMUNITY)
Admission: RE | Admit: 2022-11-14 | Discharge: 2022-11-14 | Disposition: A | Discharge status: Home / Self Care | Payer: Medicare Other | Source: Ambulatory Visit | Attending: "Endocrinology | Admitting: "Endocrinology

## 2022-11-14 ENCOUNTER — Ambulatory Visit (HOSPITAL_COMMUNITY): Payer: Medicare Other | Admitting: Speech Pathology

## 2022-11-14 ENCOUNTER — Ambulatory Visit (HOSPITAL_COMMUNITY)
Admission: RE | Admit: 2022-11-14 | Discharge: 2022-11-14 | Disposition: A | Discharge status: Home / Self Care | Payer: Medicare Other | Source: Ambulatory Visit | Attending: Gastroenterology | Admitting: Gastroenterology

## 2022-11-14 ENCOUNTER — Encounter (HOSPITAL_COMMUNITY): Payer: Self-pay

## 2022-11-14 VITALS — BP 123/75 | HR 100 | Temp 97.5°F | Resp 18 | Ht 65.0 in | Wt 85.4 lb

## 2022-11-14 DIAGNOSIS — E119 Type 2 diabetes mellitus without complications: Secondary | ICD-10-CM

## 2022-11-14 DIAGNOSIS — R1310 Dysphagia, unspecified: Secondary | ICD-10-CM | POA: Diagnosis not present

## 2022-11-14 DIAGNOSIS — R1312 Dysphagia, oropharyngeal phase: Secondary | ICD-10-CM

## 2022-11-14 DIAGNOSIS — K219 Gastro-esophageal reflux disease without esophagitis: Secondary | ICD-10-CM

## 2022-11-14 DIAGNOSIS — R638 Other symptoms and signs concerning food and fluid intake: Secondary | ICD-10-CM | POA: Diagnosis not present

## 2022-11-14 LAB — BASIC METABOLIC PANEL
Anion gap: 11 (ref 5–15)
BUN: 43 mg/dL — ABNORMAL HIGH (ref 8–23)
CO2: 25 mmol/L (ref 22–32)
Calcium: 9.5 mg/dL (ref 8.9–10.3)
Chloride: 101 mmol/L (ref 98–111)
Creatinine, Ser: 1.74 mg/dL — ABNORMAL HIGH (ref 0.44–1.00)
GFR, Estimated: 29 mL/min — ABNORMAL LOW (ref >60)
Glucose, Bld: 130 mg/dL — ABNORMAL HIGH (ref 70–99)
Potassium: 4.4 mmol/L (ref 3.5–5.1)
Sodium: 137 mmol/L (ref 135–145)

## 2022-11-14 NOTE — Therapy (Signed)
Modified Barium Swallow Study  Patient Details  Name: Ellen Cruz MRN: [id number] Date of Birth: [birth year]  Today's Date: 11/14/2022  Modified Barium Swallow completed.  Full report located under Chart Review in the Imaging Section.  History of Present Illness Ellen Cruz is an 82 yo female who was referred by Dr. Sanjuan Cruz for MBSS due to reports of difficulty swallowing solids and liquids. Pt with history of CVA (MRI shows chronic infarcts in the right basal ganglia and right cerebellum) , hypothyroidism , lymphocytic colitis on budesonide since 2019. Patient reports that dysphagia has been going on for many years but recently has been getting worse.  Reports that she has difficulty swallowing both liquids and solids.  She mostly have problems swallowing bread to the point that patient has many episodes of choking where she is unable to breathe requiring manual tapping at the back and recent Heimlich maneuver at a restaurant. Patient has almost every day retrosternal chest pain while swallowing and intermittent undigested food regurgitation.  She remains underweight because she does not have much of appetite. She was previously seen by Dr. Myrtie Neither at [facility] and he recommended MBSS in June 2022 due to suspicion of UES dysfunction, however Pt declined at that time. She tells SLP that she drinks Boost to increase her calories.   Clinical Impression Pt presents with functionally severe oropharyngeal and pharyngoesophageal dysphagia characterized by impaired lingual movement with significantly reduce tongue base retraction (vallecular residuals with thin, NTL, and puree), delay in swallow initiation triggering after spilling towards the pyriforms, reduced laryngeal elevation and anterior hyoid movement, and little to no epiglottic deflection, reduced laryngeal vestibule closure with aspiration after the swallow from residuals (thin, NTL, and likely puree) and gross aspiration of thins  via straw sips resulting in strong coughing (most aspiration was sensed), diminished pharyngeal stripping wave, and minimal distention of the UES all resulting in significant pharyngeal residue. Pt swallowed 7+ times for each bite/sip in attempt to clear pharynx. Thin liquids taken via cup sip appeared to be the most efficient and resulted in least pharyngeal residuals. Head turn L/R was ineffective. Recommend D1/2 (puree to finely chopped with moisture added) and thin liquids via small, cup sips with repeat swallows and periodic cough/throat clear and repeat swallow, PO medications crushed as able in puree, and follow solids with liquids. Chart review reveals that Pt has had difficulty swallowing for at least 3+ years and MRI reveals changes in the right basal ganglia and right cerebellum. Consider ENT (Dr. Rubye Oaks at [facility]) consult (or GI if able to do) to evaluate UES to see if dilation or botox may be beneficial and can also try a more conservative route of dysphagia therapy with SLP via outpatient to work on pharyngeal strengthening. This study and the recommendations were reviewed with Pt.   Factors that may increase risk of adverse event in presence of aspiration Rubye Oaks & Clearance Coots 2021): Frail or deconditioned  Swallow Evaluation Recommendations Recommendations: PO diet PO Diet Recommendation: Dysphagia 2 (Finely chopped);Dysphagia 1 (Pureed);Thin liquids (Level 0) Liquid Administration via: Cup;No straw Medication Administration: Crushed with puree Supervision: Patient able to self-feed Swallowing strategies  : Slow rate;Small bites/sips;Multiple dry swallows after each bite/sip;Follow solids with liquids;Clear throat intermittently Postural changes: Position pt fully upright for meals;Stay upright 30-60 min after meals Oral care recommendations: Oral care BID (2x/day) Recommended consults: Consider ENT consultation (to see if dilation of UES is possible)     Thank  you,  Havery Moros, CCC-SLP [phone]  Kiandra Sanguinetti 11/14/2022,5:01 PM

## 2022-11-16 ENCOUNTER — Telehealth (INDEPENDENT_AMBULATORY_CARE_PROVIDER_SITE_OTHER): Payer: Self-pay | Admitting: Gastroenterology

## 2022-11-16 NOTE — Telephone Encounter (Signed)
Not sure if you got this message to refer pt. Thank you

## 2022-11-16 NOTE — Telephone Encounter (Signed)
Pt states that she can not go to [facility]. Can we refer pt to someone in [place]? Please advise. Thank you.

## 2022-11-16 NOTE — Progress Notes (Signed)
Hi Ann  Can we refer this patient to ENT (Dr. Rubye Oaks at [facility]) for functionally severe oropharyngeal dysphagia as recommended by SLP evaluation

## 2022-11-16 NOTE — Telephone Encounter (Signed)
Hi Ellen Cruz , we need to cancel this upper endoscopy because SLP eval showed severe oropharyngeal as reason for her dysphagia . Patient is aspirating   She needs ENT (Dr. Rubye Oaks at [facility]) as suggested by SLP evaluation

## 2022-11-16 NOTE — Telephone Encounter (Signed)
Pt had swallow test completed and they told pt that Dr.Ahmed may not complete her EGD on 8/8 due to her food going into lungs. Pt is wanting to know if Dr.Ahmed is "still going to operate on her on 8/8"? Please advise. Thank you.

## 2022-11-17 NOTE — Telephone Encounter (Signed)
Ok thank you 

## 2022-11-17 NOTE — Telephone Encounter (Signed)
Pt states that she prefers afternoons due to bowel. Pt prefers [facility] if possible. Thank you Dr.Teoh does not deal with throat issues

## 2022-11-17 NOTE — Telephone Encounter (Signed)
Do I need to cancel referral for [facility] and refer patient to [facility]NT? You mentioned in earlier message for patient to follow up with speech and swallow therapists. Thanks

## 2022-11-20 DIAGNOSIS — I10 Essential (primary) hypertension: Secondary | ICD-10-CM | POA: Diagnosis not present

## 2022-11-20 DIAGNOSIS — E1122 Type 2 diabetes mellitus with diabetic chronic kidney disease: Secondary | ICD-10-CM | POA: Diagnosis not present

## 2022-11-20 DIAGNOSIS — E1142 Type 2 diabetes mellitus with diabetic polyneuropathy: Secondary | ICD-10-CM | POA: Diagnosis not present

## 2022-11-20 DIAGNOSIS — K580 Irritable bowel syndrome with diarrhea: Secondary | ICD-10-CM | POA: Diagnosis not present

## 2022-11-20 DIAGNOSIS — F411 Generalized anxiety disorder: Secondary | ICD-10-CM | POA: Diagnosis not present

## 2022-11-20 DIAGNOSIS — R2689 Other abnormalities of gait and mobility: Secondary | ICD-10-CM | POA: Diagnosis not present

## 2022-11-20 DIAGNOSIS — E538 Deficiency of other specified B group vitamins: Secondary | ICD-10-CM | POA: Diagnosis not present

## 2022-11-20 DIAGNOSIS — E039 Hypothyroidism, unspecified: Secondary | ICD-10-CM | POA: Diagnosis not present

## 2022-11-20 DIAGNOSIS — D692 Other nonthrombocytopenic purpura: Secondary | ICD-10-CM | POA: Diagnosis not present

## 2022-11-20 DIAGNOSIS — E440 Moderate protein-calorie malnutrition: Secondary | ICD-10-CM | POA: Diagnosis not present

## 2022-11-20 DIAGNOSIS — E7849 Other hyperlipidemia: Secondary | ICD-10-CM | POA: Diagnosis not present

## 2022-11-20 DIAGNOSIS — F332 Major depressive disorder, recurrent severe without psychotic features: Secondary | ICD-10-CM | POA: Diagnosis not present

## 2022-11-21 ENCOUNTER — Telehealth (INDEPENDENT_AMBULATORY_CARE_PROVIDER_SITE_OTHER): Payer: Self-pay | Admitting: *Deleted

## 2022-11-21 NOTE — Telephone Encounter (Signed)
Pt called with concerns. States she has been in bed for 2 days. She is weak. Wants to know if she can continue budesonide 3mg  daily. she has not stopped med yet. Does not feel like she can come off of med like discussed at visit. Wants to know why she needs to taper off. States she is losing weight every day today 83 lbs per pt. At [facility] 7/25 85 lbs. Feels like she can barley walk. Takes one boost per day. Cannot take more or she will vomit. Sometimes boost causes diarrhea. Had diarrhea one time this morning.  Has appt 8/13 with speech therapy. Feels like she cannot wait that long. She asked to see GI in [place]. I let her know her pcp would need to refer her to [facility] if she wanted to establish there instead of here. She states she will wait to hear back from [place] to see recommendations.

## 2022-11-21 NOTE — Telephone Encounter (Signed)
Pt called back and states she wants to stay at this GI office and not go to [place]. States she got choked on green beans last night. She wants to gain weight and get her strength back. Having some lower abdominal pain that she did not tell you about she said. Pain started about 2 weeks ago. Pain off and on. Pain is worse when lying down. Feels like bubbles in abdomen that are busting. Pain last a few seconds. Does not happen every day. Happens about 2 -3 times per week. Only happens when lying down or laying back in her recliner.  She states she she had one diarrhea episode today and 5 more stools after that are normal. She is asking if she needs endoscopy. See other part of message below.

## 2022-11-21 NOTE — Telephone Encounter (Signed)
Discussed all with patient and also per Dr. Tasia Catchings to taper budesonide 3mg  by taking one every other day for week 1, then week 2 take every 2 days then twice a week on week 3 then can stop med. Pt verbalized understanding of all.

## 2022-11-21 NOTE — Telephone Encounter (Signed)
Hi Ellen Cruz   Recommendations for this patient   1) Budesonide is a steroid which is not recommended long term. She was started on it years ago . If she is not having diarrhea than she can start tapering it off  2) She was a patient previously at [facility] and I have no issues she going back to them . We will be happy to see her here if she prefers   3) No endoscopy is needed as she had an endoscopy 2020 by [facility] endoscopy . Her issue is aspiration ( food going in the lungs) as evident by speech and swallow evaluation   4) if she feels very weak, unable to eat anything , difficulty breathing , fever or chills , come to the ED at that time

## 2022-11-24 ENCOUNTER — Ambulatory Visit (HOSPITAL_COMMUNITY): Admission: RE | Admit: 2022-11-24 | Payer: Medicare Other | Source: Home / Self Care

## 2022-11-24 ENCOUNTER — Telehealth (INDEPENDENT_AMBULATORY_CARE_PROVIDER_SITE_OTHER): Payer: Self-pay | Admitting: *Deleted

## 2022-11-24 ENCOUNTER — Encounter (HOSPITAL_COMMUNITY): Admission: RE | Payer: Self-pay | Source: Home / Self Care

## 2022-11-24 DIAGNOSIS — E119 Type 2 diabetes mellitus without complications: Secondary | ICD-10-CM

## 2022-11-24 SURGERY — ESOPHAGOGASTRODUODENOSCOPY (EGD) WITH PROPOFOL
Anesthesia: Monitor Anesthesia Care

## 2022-11-24 NOTE — Telephone Encounter (Signed)
Thank you for informing Toniann Fail . Yes with large amount of diarrhea and feeling weak, advising ED is appropriate

## 2022-11-24 NOTE — Telephone Encounter (Signed)
Pt called and notified this is also Dr. Marcelino Freestone recommendations. She verbalized understanding that she should go to ED with having large amount of diarrhea and feeling weak.

## 2022-11-24 NOTE — Telephone Encounter (Signed)
Pt states she is tapering off of budesonide. Took one on Monday, skipped Tuesday, took one Wednesday and had diarrhea all day, took one today and having diarrhea off and on today. Feeling weak. I advised her to go to ED but she declined to go today.   [phone]

## 2022-11-28 ENCOUNTER — Other Ambulatory Visit (INDEPENDENT_AMBULATORY_CARE_PROVIDER_SITE_OTHER): Payer: Self-pay | Admitting: *Deleted

## 2022-11-28 ENCOUNTER — Telehealth (INDEPENDENT_AMBULATORY_CARE_PROVIDER_SITE_OTHER): Payer: Self-pay | Admitting: *Deleted

## 2022-11-28 ENCOUNTER — Other Ambulatory Visit (INDEPENDENT_AMBULATORY_CARE_PROVIDER_SITE_OTHER): Payer: Self-pay | Admitting: Gastroenterology

## 2022-11-28 DIAGNOSIS — R197 Diarrhea, unspecified: Secondary | ICD-10-CM

## 2022-11-28 DIAGNOSIS — K52832 Lymphocytic colitis: Secondary | ICD-10-CM

## 2022-11-28 NOTE — Telephone Encounter (Signed)
Discussed with patient per Dr. Tasia Catchings - Given she started to have diarrhea again after tapering down budesonide. I would advise   1) Check stool for C.Diff and GI PCR ( ordering labs)  2) restart budesonide as she was taking is previously. No need to taper now   3) We should see her in clinic as she continues to have symptoms   Pt verbalized understanding of all and lab orders mailed to pt for her to do

## 2022-11-28 NOTE — Telephone Encounter (Signed)
Hi Toniann Fail   Thanks for informing me and talking to the patient extensively   Given she started to have diarrhea again after tapering down budesonide. I would advise  1) Check stool for C.Diff and GI PCR ( ordering labs) 2) restart budesonide as she was taking is previously. No need to taper now   3) We should see her in clinic as she continues to have symptoms

## 2022-11-28 NOTE — Telephone Encounter (Signed)
Pt has been tapering down on budesonide. She went to every other day last week and states she started back having diarrhea the first day she skipped med. States she has had 10 episodes of diarrhea this morning and had to take a dose even though she was suppose to skip today.  Woke up 4 times during the night. Also concerned about weight lost. States weight today is 84 lbs and it goes down to 80 lbs. She does one boost shake per day and if she tries to do more than one per day she vomits it up. Eats three meals per day and 2 -4 snacks per day. Eats yogurt and cookies states she eats anything she wants as long as it is soft.   [phone]

## 2022-11-29 ENCOUNTER — Ambulatory Visit (HOSPITAL_COMMUNITY): Payer: Medicare Other | Admitting: Speech Pathology

## 2022-11-29 ENCOUNTER — Telehealth (INDEPENDENT_AMBULATORY_CARE_PROVIDER_SITE_OTHER): Payer: Self-pay | Admitting: *Deleted

## 2022-11-29 ENCOUNTER — Encounter (HOSPITAL_COMMUNITY): Payer: Self-pay | Admitting: Speech Pathology

## 2022-11-29 ENCOUNTER — Ambulatory Visit (HOSPITAL_COMMUNITY): Payer: Medicare Other | Attending: Gastroenterology | Admitting: Speech Pathology

## 2022-11-29 DIAGNOSIS — R1312 Dysphagia, oropharyngeal phase: Secondary | ICD-10-CM | POA: Diagnosis not present

## 2022-11-29 NOTE — Therapy (Signed)
OUTPATIENT SPEECH LANGUAGE PATHOLOGY SWALLOW TREATMENT NOTE   Patient Name: Ellen Cruz MRN: [id number] DOB:Aug 24, 1940, 82 y.o., female Today's Date: 11/29/2022  PCP: Richardean Chimera, MD REFERRING PROVIDER: Sanjuan Dame  END OF SESSION:  End of Session - 11/29/22 1724     Visit Number 2    Number of Visits 4    Date for SLP Re-Evaluation 01/03/23    Authorization Type Medicare    SLP Start Time 1604    SLP Stop Time  1700    SLP Time Calculation (min) 56 min    Activity Tolerance Patient tolerated treatment well             Past Medical History:  Diagnosis Date   Anxiety    At risk for falling    has cane   Breast nodule    Cataract    bilateral cataracts removed   Chronic kidney disease    stage 3/  per pt   COVID    feb. 2022   Depression    Diabetes mellitus without complication (HCC)    no meds/controlled by diet   Diarrhea    GERD (gastroesophageal reflux disease)    HTN (hypertension)    Microscopic colitis    Stroke (HCC)    x 2   Thyroid disease    Past Surgical History:  Procedure Laterality Date   APPENDECTOMY     breast nodule surgery     carpel tunnel     CATARACT EXTRACTION     CHOLECYSTECTOMY     COLONOSCOPY     HEMORRHOID SURGERY     3 times   TONSILLECTOMY     UPPER GASTROINTESTINAL ENDOSCOPY     VAGINAL HYSTERECTOMY     Patient Active Problem List   Diagnosis Date Noted   Lymphocytic colitis 11/09/2022   Dysphagia 11/09/2022   Underweight due to inadequate caloric intake 11/09/2022    ONSET DATE: 2022   REFERRING DIAG: Dysphagia  THERAPY DIAG:  Dysphagia, oropharyngeal phase  Rationale for Evaluation and Treatment: Rehabilitation  SUBJECTIVE:   SUBJECTIVE STATEMENT: "Ellen Cruz told me to throw out all the papers you gave me after the swallow test."  Pt accompanied by: self and significant other  PERTINENT HISTORY: Ellen Cruz is an 82 yo female who was referred by Dr. Sanjuan Dame for MBSS due to  reports of difficulty swallowing solids and liquids. Pt with history of CVA (MRI shows chronic infarcts in the right basal ganglia and right cerebellum) , hypothyroidism , lymphocytic colitis on budesonide since 2019. Patient reports that dysphagia has been going on for many years but recently has been getting worse.  Reports that she has difficulty swallowing both liquids and solids.  She mostly have problems swallowing bread to the point that patient has many episodes of choking where she is unable to breathe requiring manual tapping at the back and recent Heimlich maneuver at a restaurant. Patient has almost every day retrosternal chest pain while swallowing and intermittent undigested food regurgitation.  She remains underweight because she does not have much of appetite. She was previously seen by Dr. Myrtie Neither at [facility] and he recommended MBSS in June 2022 due to suspicion of UES dysfunction, however Pt declined at that time. She tells SLP that she drinks Boost to increase her calories. MBSS was completed with this SLP on 11/14/2022 with recommendation for follow up dysphagia therapy as an outpatient.   PAIN:  Are you having pain? No  FALLS: Has patient fallen in  last 6 months?  No  LIVING ENVIRONMENT: Lives with: lives with their family Lives in: House/apartment  PLOF:  Level of assistance: Independent with ADLs, Independent with IADLs Employment: Retired  PATIENT GOALS: Improve swallow, gain weight  OBJECTIVE:   DIAGNOSTIC FINDINGS: Chronic small vessel ischemia with small remote infarcts in the right basal ganglia and right cerebellum  RECOMMENDATIONS FROM OBJECTIVE SWALLOW STUDY (MBSS/FEES):  (11/14/2022) Pt presents with functionally severe oropharyngeal and pharyngoesophageal dysphagia characterized by impaired lingual movement with significantly reduce tongue base retraction (vallecular residuals with thin, NTL, and puree), delay in swallow initiation triggering after spilling  towards the pyriforms, reduced laryngeal elevation and anterior hyoid movement, and little to no epiglottic deflection, reduced laryngeal vestibule closure with aspiration after the swallow from residuals (thin, NTL, and likely puree) and gross aspiration of thins via straw sips resulting in strong coughing (most aspiration was sensed), diminished pharyngeal stripping wave, and minimal distention of the UES all resulting in significant pharyngeal residue. Pt swallowed 7+ times for each bite/sip in attempt to clear pharynx. Thin liquids taken via cup sip appeared to be the most efficient and resulted in least pharyngeal residuals. Head turn L/R was ineffective. Recommend D1/2 (puree to finely chopped with moisture added) and thin liquids via small, cup sips with repeat swallows and periodic cough/throat clear and repeat swallow, PO medications crushed as able in puree, and follow solids with liquids. Chart review reveals that Pt has had difficulty swallowing for at least 3+ years and MRI reveals changes in the right basal ganglia and right cerebellum. Consider ENT (Dr. Rubye O[facility] at [facility]) consult (or GI if able to do) to evaluate UES to see if dilation or botox may be beneficial and can also try a more conservative route of dysphagia therapy with SLP via outpatient to work on pharyngeal strengthening. This study and the recommendations were reviewed with Pt.   COGNITION: Overall cognitive status: Within functional limits for tasks assessed Areas of impairment:  N/A  ORAL MOTOR EXAMINATION: Overall status: WFL   TODAY'S TREATMENT:  Pt accompanied to therapy by her husband who reports that he was just at [facility] for MBSS due to recent completion of radiation treatment for throat cancer. Imaging from Ellen Cruz's MBSS was reviewed with Pt and spouse. Pt reports that she primarily eats microwaved white rice, canned beets, TV dinner meatloaf, cream potatoes, english peas, and one Boost a day. She  weighs ~85 pounds and reports feeling very weak. SLP provided Pt a list of soft solids with encouragement to increase protein and healthy fats. She may benefit from a consultation with a registered dietician to help her with food choices. She completed effortful swallow, chin tuck against resistance swallow, and attempted the Masako. Pt with little success with the Masako, but able to complete the other two. She stated that she threw all the information I gave her after her MBSS in the trash can because she said someone at the GI office told her to do so (?). SLP provided additional written information regarding food choices, food journal, and swallow precautions. Plan to see Pt for 2-3 more visits for dysphagia intervention.  DATE: 11/29/22   PATIENT EDUCATION: Education details: soft texture recommendations, swallowing exercises, food journal Person educated: Patient and Spouse Education method: Explanation, Demonstration, and Handouts Education comprehension: needs further education   ASSESSMENT:  CLINICAL IMPRESSION: Patient is an 82 y.o. female who was seen today for dysphagia intervention following MBSS completed on 11/14/2022. She presents with severe oropharyngeal and pharyngoesophageal dysphagia as stated above from her MBSS on 11/14/2022 with weakness (impaired lingual retraction, epiglottic deflection, hyolaryngeal excursion, diminished pharyngeal stripping wave, reduced laryngeal vestibule closure, and reduced relaxation of the UES). Pt with variable sensed aspiration and significant pharyngeal residuals with need for 5+ swallows to clear bolus. Pt is unable to travel to [facility] to see ENT for possible UES dysfunction and would like to try to come to dysphagia therapy. SLP will see Pt for education and pharyngeal strengthening as able.   OBJECTIVE  IMPAIRMENTS: include dysphagia. These impairments are limiting patient from safety when swallowing and efficiency with swallowing . Factors affecting potential to achieve goals and functional outcome are ability to learn/carryover information and severity of impairments. Patient will benefit from skilled SLP services to address above impairments and improve overall function.  REHAB POTENTIAL: Fair ; Dysphagia for several years   GOALS: Goals reviewed with patient? Yes  SHORT TERM GOALS: Target date: 01/03/2023  Pt will complete pharyngeal swallowing exercises including but not limited to: masako, effortful swallow, Mendelsohn, lingual press, chin tuck against resistance, and laryngeal closure with initial model provided by SLP and daily completion of 3x per day per Pt self-report Baseline: Mod assist Goal status: INITIAL  2.  Pt will consume least restrictive diet of D2 and mechanical soft textures and thin liquids with use of compensatory strategies with indirect cues. Baseline: Pt weighs 85 pounds and primarily eats vegetables and small amounts, provided food choices Goal status: INITIAL  3.  Pt will verbalize 3 signs of possible aspiration pneumonia and/or risks for aspiration with written cue as needed. Baseline: Provided written info this date Goal status: INITIAL   LONG TERM GOALS: Same as long term goals  PLAN:  SLP FREQUENCY: 1x/week  SLP DURATION: 3 weeks  PLANNED INTERVENTIONS: Aspiration precaution training, Pharyngeal strengthening exercises, Diet toleration management , SLP instruction and feedback, Compensatory strategies, and Patient/family education   Thank you,  Havery Moros, CCC-SLP [phone]  ,, CCC-SLP 11/29/2022, 5:35 PM

## 2022-11-29 NOTE — Telephone Encounter (Signed)
Pt left vm that she called pcp to see if they would do labs that Dr. Tasia Catchings wants her to do but she has not heard back yet.  I called and spoke with rachel at [facility] and she said she would ask Dr. Reuel Boom her pcp if he could order her labs that dr Tasia Catchings wants since they do not do outside labs at that office. She called back and said dr Reuel Boom said he could not order and pt will need to come to [facility] in [place] to do labs. Pt left on vm that she would not be able to come to [place] and not to mail out orders if I havent already done so. ( I did mail out labs yesterday) I tried to call pt back to discuss and no answer [phone]

## 2022-11-30 DIAGNOSIS — H6123 Impacted cerumen, bilateral: Secondary | ICD-10-CM | POA: Diagnosis not present

## 2022-11-30 NOTE — Telephone Encounter (Signed)
Talked with patient and let her know I mailed lab orders and talked with her pcp office who is unable to let her come in with outside orders so she will need to do at [facility] in [place]. Pt states her stools are better and not currently having diarrhea and states she does not want to do labs at this time.

## 2022-12-01 DIAGNOSIS — E1142 Type 2 diabetes mellitus with diabetic polyneuropathy: Secondary | ICD-10-CM | POA: Diagnosis not present

## 2022-12-01 DIAGNOSIS — B351 Tinea unguium: Secondary | ICD-10-CM | POA: Diagnosis not present

## 2022-12-01 DIAGNOSIS — L84 Corns and callosities: Secondary | ICD-10-CM | POA: Diagnosis not present

## 2022-12-01 DIAGNOSIS — M79676 Pain in unspecified toe(s): Secondary | ICD-10-CM | POA: Diagnosis not present

## 2022-12-05 ENCOUNTER — Emergency Department (HOSPITAL_COMMUNITY)
Admission: EM | Admit: 2022-12-05 | Discharge: 2022-12-05 | Disposition: A | Discharge status: Home / Self Care | Payer: Medicare Other | Attending: Emergency Medicine | Admitting: Emergency Medicine

## 2022-12-05 ENCOUNTER — Encounter (HOSPITAL_COMMUNITY): Payer: Self-pay | Admitting: *Deleted

## 2022-12-05 ENCOUNTER — Telehealth (INDEPENDENT_AMBULATORY_CARE_PROVIDER_SITE_OTHER): Payer: Self-pay | Admitting: *Deleted

## 2022-12-05 ENCOUNTER — Emergency Department (HOSPITAL_COMMUNITY): Payer: Medicare Other

## 2022-12-05 ENCOUNTER — Other Ambulatory Visit: Payer: Self-pay

## 2022-12-05 DIAGNOSIS — R531 Weakness: Secondary | ICD-10-CM | POA: Diagnosis not present

## 2022-12-05 DIAGNOSIS — R1310 Dysphagia, unspecified: Secondary | ICD-10-CM | POA: Diagnosis not present

## 2022-12-05 DIAGNOSIS — R000 Tachycardia, unspecified: Secondary | ICD-10-CM | POA: Diagnosis not present

## 2022-12-05 DIAGNOSIS — R140 Abdominal distension (gaseous): Secondary | ICD-10-CM | POA: Diagnosis not present

## 2022-12-05 LAB — CBC
HCT: 37.9 % (ref 36.0–46.0)
Hemoglobin: 12.9 g/dL (ref 12.0–15.0)
MCH: 29 pg (ref 26.0–34.0)
MCHC: 34 g/dL (ref 30.0–36.0)
MCV: 85.2 fL (ref 80.0–100.0)
Platelets: 147 10*3/uL — ABNORMAL LOW (ref 150–400)
RBC: 4.45 MIL/uL (ref 3.87–5.11)
RDW: 13.6 % (ref 11.5–15.5)
WBC: 10.7 10*3/uL — ABNORMAL HIGH (ref 4.0–10.5)
nRBC: 0 % (ref 0.0–0.2)

## 2022-12-05 LAB — URINALYSIS, ROUTINE W REFLEX MICROSCOPIC
Bilirubin Urine: NEGATIVE
Glucose, UA: NEGATIVE mg/dL
Hgb urine dipstick: NEGATIVE
Ketones, ur: NEGATIVE mg/dL
Leukocytes,Ua: NEGATIVE
Nitrite: NEGATIVE
Protein, ur: NEGATIVE mg/dL
Specific Gravity, Urine: 1.016 (ref 1.005–1.030)
pH: 5 (ref 5.0–8.0)

## 2022-12-05 LAB — BASIC METABOLIC PANEL
Anion gap: 9 (ref 5–15)
BUN: 36 mg/dL — ABNORMAL HIGH (ref 8–23)
CO2: 26 mmol/L (ref 22–32)
Calcium: 9.5 mg/dL (ref 8.9–10.3)
Chloride: 99 mmol/L (ref 98–111)
Creatinine, Ser: 1.42 mg/dL — ABNORMAL HIGH (ref 0.44–1.00)
GFR, Estimated: 37 mL/min — ABNORMAL LOW (ref >60)
Glucose, Bld: 175 mg/dL — ABNORMAL HIGH (ref 70–99)
Potassium: 4.5 mmol/L (ref 3.5–5.1)
Sodium: 134 mmol/L — ABNORMAL LOW (ref 135–145)

## 2022-12-05 LAB — CBG MONITORING, ED: Glucose-Capillary: 93 mg/dL (ref 70–99)

## 2022-12-05 MED ORDER — LACTATED RINGERS IV BOLUS
500.0000 mL | Freq: Once | INTRAVENOUS | Status: AC
  Administered 2022-12-05: 500 mL via INTRAVENOUS

## 2022-12-05 NOTE — Telephone Encounter (Signed)
Patient called and states she wanted to ask Dr. Tasia Catchings if she should go to [facility] or [facility]. States she is so weak she can't barely walk. Pt was advised she should go to ED and she states she will go to [facility] ED.   [phone]

## 2022-12-05 NOTE — ED Provider Triage Note (Signed)
Emergency Medicine Provider Triage Evaluation Note  Ellen Cruz , a 82 y.o. female  was evaluated in triage.  Pt complains of weight loss and generalized weakness, fatigue, sent by PCP for further evaluation, told she needed some IV fluids  Review of Systems  Positive: Fatigue Negative: Fever, vomiting  Physical Exam  BP 115/73 (BP Location: Right Arm)   Pulse (!) 106   Temp (!) 97.5 F (36.4 C) (Oral)   Resp 20   Ht 5\' 5"  (1.651 m)   Wt 37.2 kg   SpO2 97%   BMI 13.65 kg/m  Gen:   Awake, no distress   Resp:  Normal effort  MSK:   Moves extremities without difficulty  Other:    Medical Decision Making  Medically screening exam initiated at 2:16 PM.  Appropriate orders placed.  Ninfa Meeker was informed that the remainder of the evaluation will be completed by another provider, this initial triage assessment does not replace that evaluation, and the importance of remaining in the ED until their evaluation is complete.     Ma Rings, [place] 12/05/22 1422

## 2022-12-05 NOTE — ED Provider Notes (Signed)
[place] EMERGENCY DEPARTMENT AT [facility] Provider Note   CSN: [id number] Arrival date & time: 12/05/22  1152     History  Chief Complaint  Patient presents with   Weakness    Ellen Cruz is a 82 y.o. female.  PMH of CVA, GERD, dysphagia.  Has been by speech-language pathology dysphagia.  Patient states Ellen Cruz is using food journal but did not gain any weight over the past week despite eating Ellen Cruz feels has been adequate, states Ellen Cruz still feeling weak, Ellen Cruz called her primary care doctor advise Ellen Cruz come to the ER because Ellen Cruz might need some fluids.  No fever, no chills, no dizziness, no chest pain or shortness of breath, no other complaints.   Weakness      Home Medications Prior to Admission medications   Medication Sig Start Date End Date Taking? Authorizing Provider  budesonide (ENTOCORT EC) 3 MG 24 hr capsule Take 1 capsule (3 mg total) by mouth daily. 11/19/20  Yes Danis, Andreas Blower, MD  Cholecalciferol (VITAMIN D3) 25 MCG (1000 UT) CAPS Take 1 capsule by mouth daily.   Yes [provider]  diazepam (VALIUM) 10 MG tablet Take 10 mg by mouth 3 (three) times daily as needed for anxiety. 08/15/17  Yes [provider]  guaifenesin (HUMIBID E) 400 MG TABS tablet Take 200 mg by mouth as needed (congestion).   Yes [provider]  hydroxypropyl methylcellulose / hypromellose (ISOPTO TEARS / GONIOVISC) 2.5 % ophthalmic solution Place 1 drop into both eyes as needed for dry eyes.   Yes [provider]  levothyroxine (SYNTHROID) 50 MCG tablet Take 50 mcg by mouth daily. 07/19/17  Yes [provider]  pantoprazole (PROTONIX) 40 MG tablet Take 1 tablet (40 mg total) by mouth daily. Patient taking differently: Take 40 mg by mouth at bedtime. 11/09/22  Yes Ahmed, Juanetta Beets, MD  Rosuvastatin Calcium 20 MG CPSP Take 1 tablet by mouth once a week.   Yes [provider]      Allergies    Sulfa antibiotics, Glycopyrrolate,  Aspirin, Codeine, Lactose intolerance (gi), Lexapro [escitalopram oxalate], Phenothiazines, Statins, and Zyrtec [cetirizine]    Review of Systems   Review of Systems  Neurological:  Positive for weakness.    Physical Exam Updated Vital Signs BP 132/86   Pulse 95   Temp 97.9 F (36.6 C) (Oral)   Resp 17   Ht 5\' 5"  (1.651 m)   Wt 37.2 kg   SpO2 98%   BMI 13.65 kg/m  Physical Exam Vitals and nursing note reviewed.  Constitutional:      General: Ellen Cruz is not in acute distress.    Appearance: Ellen Cruz is well-developed.  HENT:     Head: Normocephalic and atraumatic.     Mouth/Throat:     Mouth: Mucous membranes are moist.  Eyes:     Conjunctiva/sclera: Conjunctivae normal.  Cardiovascular:     Rate and Rhythm: Normal rate and regular rhythm.     Heart sounds: No murmur heard. Pulmonary:     Effort: Pulmonary effort is normal. No respiratory distress.     Breath sounds: Normal breath sounds.  Abdominal:     Palpations: Abdomen is soft.     Tenderness: There is no abdominal tenderness.  Musculoskeletal:        General: No swelling. Normal range of motion.     Cervical back: Neck supple.  Skin:    General: Skin is warm and dry.  Capillary Refill: Capillary refill takes less than 2 seconds.  Neurological:     General: No focal deficit present.     Mental Status: Ellen Cruz is alert and oriented to person, place, and time.  Psychiatric:        Mood and Affect: Mood normal.     ED Results / Procedures / Treatments   Labs (all labs ordered are listed, but only abnormal results are displayed) Labs Reviewed  BASIC METABOLIC PANEL - Abnormal; Notable for the following components:      Result Value   Sodium 134 (*)    Glucose, Bld 175 (*)    BUN 36 (*)    Creatinine, Ser 1.42 (*)    GFR, Estimated 37 (*)    All other components within normal limits  CBC - Abnormal; Notable for the following components:   WBC 10.7 (*)    Platelets 147 (*)    All other components within normal  limits  URINALYSIS, ROUTINE W REFLEX MICROSCOPIC  CBG MONITORING, ED    EKG None  Radiology DG Chest Portable 1 View  Result Date: 12/05/2022 CLINICAL DATA:  82 year old female with weakness, reports Ellen Cruz aspirated on swallow test last month. Decreased p.o. EXAM: PORTABLE CHEST 1 VIEW COMPARISON:  CT Abdomen and Pelvis 09/06/2018. FINDINGS: Portable AP upright view at 1543 hours. Lung volumes and mediastinal contours are within normal limits. Allowing for portable technique the lungs are clear. Visualized tracheal air column is within normal limits. Stable cholecystectomy clips. Paucity of bowel gas in the upper abdomen. No acute osseous abnormality identified. IMPRESSION: Negative portable chest. Electronically Signed   By: Odessa Fleming M.D.   On: 12/05/2022 16:51    Procedures Procedures    Medications Ordered in ED Medications  lactated ringers bolus 500 mL (0 mLs Intravenous Stopped 12/05/22 1730)    ED Course/ Medical Decision Making/ A&P                                 Medical Decision Making Differential diagnosis: Dysphagia, aspiration, pneumonia, malignancy, malabsorption disorder, other Course: Patient is here for generalized weakness.  Ellen Cruz has been having trouble with dysphagia, saw speech therapy and had a swallow study, Ellen Cruz was given soft diet and advised on exercises and has speech therapy follow-up scheduled until Ellen Cruz can see ENT for possible upper esophageal dilation next month.  Ellen Cruz has been following the diet plan for the past 5 days and states Ellen Cruz has not gained any weight yet is frustrated, still having some generalized weakness.  Ellen Cruz called her PCP who advised Ellen Cruz come get some IV fluids.  Her labs are reassuring, BUN and creatinine are actually improved from several days ago.  No UTI, chest x-ray shows no pneumonia, no pulmonary edema or infiltrate or other acute process.  I long discussion with the patient that weight gain will take time.  Went over her diet and Ellen Cruz is  eating regular meals but they tend to be very low calorie such as the banana and some cereal or an egg or a sausage biscuit.  Discussed that Ellen Cruz could try increasing her calorie density in her food such as eating whole milk yogurt instead of the fat-free low-calorie yogurt.  Is going to follow-up with her speech pathologist and her PCP.  Advised on follow-up and return precautions.  Vitals reassuring, Ellen Cruz is well-appearing, Ellen Cruz banana and yogurt in the room without difficulty swallowing, no signs of aspiration,  no recent choking episodes.  Ellen Cruz reports Ellen Cruz is trying to do her exercises though some of the more difficult and Ellen Cruz is working on it.  Amount and/or Complexity of Data Reviewed Labs: ordered. Radiology: ordered.           Final Clinical Impression(s) / ED Diagnoses Final diagnoses:  Dysphagia, unspecified type    Rx / DC Orders ED Discharge Orders     None         Ma Rings, PA-C 12/05/22 1801    Gloris Manchester, MD 12/06/22 0004

## 2022-12-05 NOTE — Discharge Instructions (Signed)
Follow-up with your primary care doctor and your speech pathologist as well as the ENT doctor you have been referred to.  Continue eating regularly to help gain weight.  Back to the ER if you have new or worsening symptoms.

## 2022-12-05 NOTE — ED Triage Notes (Signed)
Pt states she is weak and has been for the last few months; pt states she had a swallow test last week and failed it causing her to aspirate; pt states she has been unable to eat or drink and she states her doctor told her to come to ED to get fluids

## 2022-12-05 NOTE — Progress Notes (Signed)
Patient offered and consumed applesauce and yogurt, ambulated with steady gait to restroom, urine collected.

## 2022-12-06 ENCOUNTER — Ambulatory Visit (HOSPITAL_COMMUNITY): Payer: Medicare Other | Admitting: Speech Pathology

## 2022-12-08 ENCOUNTER — Telehealth (INDEPENDENT_AMBULATORY_CARE_PROVIDER_SITE_OTHER): Payer: Self-pay | Admitting: *Deleted

## 2022-12-08 DIAGNOSIS — R634 Abnormal weight loss: Secondary | ICD-10-CM | POA: Diagnosis not present

## 2022-12-08 DIAGNOSIS — F332 Major depressive disorder, recurrent severe without psychotic features: Secondary | ICD-10-CM | POA: Diagnosis not present

## 2022-12-08 DIAGNOSIS — R2689 Other abnormalities of gait and mobility: Secondary | ICD-10-CM | POA: Diagnosis not present

## 2022-12-08 DIAGNOSIS — I10 Essential (primary) hypertension: Secondary | ICD-10-CM | POA: Diagnosis not present

## 2022-12-08 DIAGNOSIS — E1122 Type 2 diabetes mellitus with diabetic chronic kidney disease: Secondary | ICD-10-CM | POA: Diagnosis not present

## 2022-12-08 DIAGNOSIS — K580 Irritable bowel syndrome with diarrhea: Secondary | ICD-10-CM | POA: Diagnosis not present

## 2022-12-08 DIAGNOSIS — D692 Other nonthrombocytopenic purpura: Secondary | ICD-10-CM | POA: Diagnosis not present

## 2022-12-08 DIAGNOSIS — E538 Deficiency of other specified B group vitamins: Secondary | ICD-10-CM | POA: Diagnosis not present

## 2022-12-08 DIAGNOSIS — M1711 Unilateral primary osteoarthritis, right knee: Secondary | ICD-10-CM | POA: Diagnosis not present

## 2022-12-08 DIAGNOSIS — N1832 Chronic kidney disease, stage 3b: Secondary | ICD-10-CM | POA: Diagnosis not present

## 2022-12-08 DIAGNOSIS — F411 Generalized anxiety disorder: Secondary | ICD-10-CM | POA: Diagnosis not present

## 2022-12-08 DIAGNOSIS — E440 Moderate protein-calorie malnutrition: Secondary | ICD-10-CM | POA: Diagnosis not present

## 2022-12-08 NOTE — Telephone Encounter (Signed)
Tried to call no answer

## 2022-12-08 NOTE — Telephone Encounter (Signed)
Pt left vm asking for a call back.   [phone]
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
## 2022-12-13 ENCOUNTER — Ambulatory Visit (HOSPITAL_COMMUNITY): Payer: Medicare Other | Admitting: Speech Pathology

## 2022-12-14 DIAGNOSIS — R110 Nausea: Secondary | ICD-10-CM | POA: Diagnosis not present

## 2022-12-14 DIAGNOSIS — R109 Unspecified abdominal pain: Secondary | ICD-10-CM | POA: Diagnosis not present

## 2022-12-14 DIAGNOSIS — K5730 Diverticulosis of large intestine without perforation or abscess without bleeding: Secondary | ICD-10-CM | POA: Diagnosis not present

## 2022-12-14 DIAGNOSIS — R634 Abnormal weight loss: Secondary | ICD-10-CM | POA: Diagnosis not present

## 2022-12-14 DIAGNOSIS — K30 Functional dyspepsia: Secondary | ICD-10-CM | POA: Diagnosis not present

## 2022-12-15 ENCOUNTER — Telehealth (INDEPENDENT_AMBULATORY_CARE_PROVIDER_SITE_OTHER): Payer: Self-pay | Admitting: *Deleted

## 2022-12-15 NOTE — Telephone Encounter (Signed)
Pt called to report her weight is dropping. Weighs herself 5 -6 times per day. States it is 83.1 lbs today. She has seen speech therapy one time and goes back next Tuesday. States she is having a hard time eating due to all the mucus she has. She is on 3rd box of mucus relief. Drinks boost one daily. Vomits when she tries to drink more than one per day. Has concerns about appt coming up to have her teeth cleaned. Concerned about aspiration while cleaning teeth. If you feel she can have it done she is requesting a letter stating that she has aspiration and needs to be sat up and let them suck out all the fluid and not let her swallow it.   [phone].

## 2022-12-16 ENCOUNTER — Other Ambulatory Visit (INDEPENDENT_AMBULATORY_CARE_PROVIDER_SITE_OTHER): Payer: Self-pay | Admitting: Gastroenterology

## 2022-12-16 DIAGNOSIS — E43 Unspecified severe protein-calorie malnutrition: Secondary | ICD-10-CM

## 2022-12-16 DIAGNOSIS — R109 Unspecified abdominal pain: Secondary | ICD-10-CM

## 2022-12-16 DIAGNOSIS — R634 Abnormal weight loss: Secondary | ICD-10-CM

## 2022-12-16 DIAGNOSIS — R1312 Dysphagia, oropharyngeal phase: Secondary | ICD-10-CM

## 2022-12-16 NOTE — Telephone Encounter (Signed)
Spoke to pt today and states today her weight is 81 lbs. She is concerned she may have cancer. Also see note below from yesterday. Thanks

## 2022-12-16 NOTE — Telephone Encounter (Signed)
Ellen Cruz CT out until end of September. Will it be ok to wait until then? Patient does not want to travel to [place]. Please advise. Thank you

## 2022-12-16 NOTE — Telephone Encounter (Signed)
Hi Ellen Cruz   Because of weight loss and many complaints patient have  I am ordering CT chest abdomen and Pelvis  Kenney Houseman can you please help schedule that  [name], can we have this patient seen in clinic with me or any APP in next 2-3 weeks

## 2022-12-16 NOTE — Telephone Encounter (Signed)
Secure chat sent to provider stating that CT at [facility] are out until end of September. (First available is last week in September). Will wait further instructions from provider

## 2022-12-17 DIAGNOSIS — Z681 Body mass index (BMI) 19 or less, adult: Secondary | ICD-10-CM | POA: Diagnosis not present

## 2022-12-17 DIAGNOSIS — R634 Abnormal weight loss: Secondary | ICD-10-CM | POA: Diagnosis not present

## 2022-12-17 DIAGNOSIS — R531 Weakness: Secondary | ICD-10-CM | POA: Diagnosis not present

## 2022-12-18 DIAGNOSIS — R531 Weakness: Secondary | ICD-10-CM | POA: Diagnosis not present

## 2022-12-18 DIAGNOSIS — R634 Abnormal weight loss: Secondary | ICD-10-CM | POA: Diagnosis not present

## 2022-12-20 ENCOUNTER — Telehealth (INDEPENDENT_AMBULATORY_CARE_PROVIDER_SITE_OTHER): Payer: Self-pay | Admitting: *Deleted

## 2022-12-20 ENCOUNTER — Ambulatory Visit (HOSPITAL_COMMUNITY): Payer: Medicare Other | Admitting: Speech Pathology

## 2022-12-20 NOTE — Telephone Encounter (Signed)
If patient is unable to go to [place] than the only option is to get it done at [facility], we can schedule that

## 2022-12-20 NOTE — Telephone Encounter (Signed)
Patient is unable to come to the appt with dr Tasia Catchings on 9/30. States she has another appt that day. She would like to reschedule for another day in the afternoon with a provider [address]. She did not want to go to a different office. Ok with Dr. Tasia Catchings or Leeroy Bock. She canceled appt with speech therapy today due to not being able to make it there due to being weak. She also states Dr. Reuel Boom scheduled ct and she had it done last Tuesday. She has heard back about results from dr Reuel Boom. She states she will call today and let me know if results are in.

## 2022-12-21 NOTE — Telephone Encounter (Signed)
Spoke with Ethelene Browns at [facility] and pt is scheduled for 01/20/23 at [facility]. Pt is to arrive at 2pm to drink contrast. Pt phone keeps disconnecting. Pt states that she already had a CT done by PCP last week. Will call to cancel the CT for October. Pt states at this time she would not like a follow up because she is no better. Pt is going to have blood work by PCP next Friday and visit with PCP on 12/30/22. Pt states at this time she has not heard from PCP about CT results.

## 2022-12-21 NOTE — Telephone Encounter (Signed)
error 

## 2022-12-22 ENCOUNTER — Telehealth (INDEPENDENT_AMBULATORY_CARE_PROVIDER_SITE_OTHER): Payer: Self-pay | Admitting: *Deleted

## 2022-12-22 NOTE — Telephone Encounter (Signed)
Pt had ct abd done on 8/28 that was ordered by pcp. I called [facility] and requested a copy of report and it is scanned in under media for you to review. Pt declined to make follow up appt. She did want you to review report. It was done without contrast due to her not being about to drink solution.

## 2022-12-22 NOTE — Telephone Encounter (Signed)
Pt wanted appt on 10/1 canceled. She said dont reschedule. She will call back to  reschedule when she is able to come in.

## 2022-12-22 NOTE — Telephone Encounter (Signed)
Thanks Toniann Fail , I saw the CT Abdomen report . I can discuss further management if patient wants to be seen in clinic

## 2022-12-23 DIAGNOSIS — E039 Hypothyroidism, unspecified: Secondary | ICD-10-CM | POA: Diagnosis not present

## 2022-12-23 DIAGNOSIS — E782 Mixed hyperlipidemia: Secondary | ICD-10-CM | POA: Diagnosis not present

## 2022-12-23 DIAGNOSIS — E1142 Type 2 diabetes mellitus with diabetic polyneuropathy: Secondary | ICD-10-CM | POA: Diagnosis not present

## 2022-12-23 DIAGNOSIS — N189 Chronic kidney disease, unspecified: Secondary | ICD-10-CM | POA: Diagnosis not present

## 2022-12-23 DIAGNOSIS — K219 Gastro-esophageal reflux disease without esophagitis: Secondary | ICD-10-CM | POA: Diagnosis not present

## 2022-12-23 DIAGNOSIS — E7849 Other hyperlipidemia: Secondary | ICD-10-CM | POA: Diagnosis not present

## 2022-12-27 NOTE — Telephone Encounter (Signed)
Pt declined to make appt at this time

## 2022-12-30 DIAGNOSIS — E1142 Type 2 diabetes mellitus with diabetic polyneuropathy: Secondary | ICD-10-CM | POA: Diagnosis not present

## 2022-12-30 DIAGNOSIS — R4582 Worries: Secondary | ICD-10-CM | POA: Diagnosis not present

## 2022-12-30 DIAGNOSIS — E1122 Type 2 diabetes mellitus with diabetic chronic kidney disease: Secondary | ICD-10-CM | POA: Diagnosis not present

## 2022-12-30 DIAGNOSIS — I10 Essential (primary) hypertension: Secondary | ICD-10-CM | POA: Diagnosis not present

## 2022-12-30 DIAGNOSIS — K580 Irritable bowel syndrome with diarrhea: Secondary | ICD-10-CM | POA: Diagnosis not present

## 2022-12-30 DIAGNOSIS — E440 Moderate protein-calorie malnutrition: Secondary | ICD-10-CM | POA: Diagnosis not present

## 2022-12-30 DIAGNOSIS — F411 Generalized anxiety disorder: Secondary | ICD-10-CM | POA: Diagnosis not present

## 2022-12-30 DIAGNOSIS — E538 Deficiency of other specified B group vitamins: Secondary | ICD-10-CM | POA: Diagnosis not present

## 2022-12-30 DIAGNOSIS — E7849 Other hyperlipidemia: Secondary | ICD-10-CM | POA: Diagnosis not present

## 2022-12-30 DIAGNOSIS — M1711 Unilateral primary osteoarthritis, right knee: Secondary | ICD-10-CM | POA: Diagnosis not present

## 2022-12-30 DIAGNOSIS — D692 Other nonthrombocytopenic purpura: Secondary | ICD-10-CM | POA: Diagnosis not present

## 2022-12-30 DIAGNOSIS — Z23 Encounter for immunization: Secondary | ICD-10-CM | POA: Diagnosis not present

## 2023-01-11 DIAGNOSIS — R531 Weakness: Secondary | ICD-10-CM | POA: Diagnosis not present

## 2023-01-11 DIAGNOSIS — R634 Abnormal weight loss: Secondary | ICD-10-CM | POA: Diagnosis not present

## 2023-01-11 DIAGNOSIS — Z681 Body mass index (BMI) 19 or less, adult: Secondary | ICD-10-CM | POA: Diagnosis not present

## 2023-01-16 ENCOUNTER — Ambulatory Visit (INDEPENDENT_AMBULATORY_CARE_PROVIDER_SITE_OTHER): Payer: Medicare Other | Admitting: Gastroenterology

## 2023-01-16 ENCOUNTER — Ambulatory Visit: Payer: Self-pay | Admitting: "Endocrinology

## 2023-01-17 ENCOUNTER — Ambulatory Visit (INDEPENDENT_AMBULATORY_CARE_PROVIDER_SITE_OTHER): Payer: Medicare Other | Admitting: Gastroenterology

## 2023-01-24 ENCOUNTER — Other Ambulatory Visit (HOSPITAL_COMMUNITY): Payer: Medicare Other

## 2023-02-01 ENCOUNTER — Ambulatory Visit (INDEPENDENT_AMBULATORY_CARE_PROVIDER_SITE_OTHER): Payer: Medicare Other | Admitting: Otolaryngology

## 2023-02-01 ENCOUNTER — Encounter (INDEPENDENT_AMBULATORY_CARE_PROVIDER_SITE_OTHER): Payer: Self-pay

## 2023-02-01 VITALS — Ht 61.0 in | Wt 80.0 lb

## 2023-02-01 DIAGNOSIS — H6123 Impacted cerumen, bilateral: Secondary | ICD-10-CM

## 2023-02-02 DIAGNOSIS — L84 Corns and callosities: Secondary | ICD-10-CM | POA: Diagnosis not present

## 2023-02-02 DIAGNOSIS — E1142 Type 2 diabetes mellitus with diabetic polyneuropathy: Secondary | ICD-10-CM | POA: Diagnosis not present

## 2023-02-02 DIAGNOSIS — B351 Tinea unguium: Secondary | ICD-10-CM | POA: Diagnosis not present

## 2023-02-02 DIAGNOSIS — M79676 Pain in unspecified toe(s): Secondary | ICD-10-CM | POA: Diagnosis not present

## 2023-02-05 DIAGNOSIS — H6123 Impacted cerumen, bilateral: Secondary | ICD-10-CM | POA: Insufficient documentation

## 2023-02-05 NOTE — Progress Notes (Unsigned)
Patient ID: Ellen Cruz, female   DOB: 12-08-1940, 82 y.o.   MRN: [id number]  Procedure: Bilateral cerumen disimpaction.   Indication: Frequent recurrent cerumen impaction, resulting in ear discomfort and conductive hearing loss.   Description: The patient is placed supine on the exam table. Under the operating microscope, the right ear canal is examined and is noted to be impacted with cerumen. The cerumen is carefully removed with a combination of suction catheters, cerumen curette, and alligator forceps. After the cerumen removal, the ear canal and tympanic membrane are noted to be normal. No middle ear effusion is noted. The same procedure is then repeated on the left side without exception. The patient tolerated the procedure well.  Follow-up care:  The patient is instructed not to use Q-tips to clean the ear canals. The patient will follow up in 2-3 months.

## 2023-02-15 DIAGNOSIS — E538 Deficiency of other specified B group vitamins: Secondary | ICD-10-CM | POA: Diagnosis not present

## 2023-02-15 DIAGNOSIS — D692 Other nonthrombocytopenic purpura: Secondary | ICD-10-CM | POA: Diagnosis not present

## 2023-02-15 DIAGNOSIS — Z23 Encounter for immunization: Secondary | ICD-10-CM | POA: Diagnosis not present

## 2023-02-15 DIAGNOSIS — R059 Cough, unspecified: Secondary | ICD-10-CM | POA: Diagnosis not present

## 2023-02-15 DIAGNOSIS — R634 Abnormal weight loss: Secondary | ICD-10-CM | POA: Diagnosis not present

## 2023-02-15 DIAGNOSIS — E1142 Type 2 diabetes mellitus with diabetic polyneuropathy: Secondary | ICD-10-CM | POA: Diagnosis not present

## 2023-02-15 DIAGNOSIS — I10 Essential (primary) hypertension: Secondary | ICD-10-CM | POA: Diagnosis not present

## 2023-02-15 DIAGNOSIS — E440 Moderate protein-calorie malnutrition: Secondary | ICD-10-CM | POA: Diagnosis not present

## 2023-02-15 DIAGNOSIS — F411 Generalized anxiety disorder: Secondary | ICD-10-CM | POA: Diagnosis not present

## 2023-02-15 DIAGNOSIS — K580 Irritable bowel syndrome with diarrhea: Secondary | ICD-10-CM | POA: Diagnosis not present

## 2023-02-15 DIAGNOSIS — E1122 Type 2 diabetes mellitus with diabetic chronic kidney disease: Secondary | ICD-10-CM | POA: Diagnosis not present

## 2023-02-15 DIAGNOSIS — F331 Major depressive disorder, recurrent, moderate: Secondary | ICD-10-CM | POA: Diagnosis not present

## 2023-02-18 DIAGNOSIS — R2689 Other abnormalities of gait and mobility: Secondary | ICD-10-CM | POA: Diagnosis not present

## 2023-02-18 DIAGNOSIS — E1142 Type 2 diabetes mellitus with diabetic polyneuropathy: Secondary | ICD-10-CM | POA: Diagnosis not present

## 2023-02-18 DIAGNOSIS — F411 Generalized anxiety disorder: Secondary | ICD-10-CM | POA: Diagnosis not present

## 2023-02-18 DIAGNOSIS — E440 Moderate protein-calorie malnutrition: Secondary | ICD-10-CM | POA: Diagnosis not present

## 2023-02-18 DIAGNOSIS — I10 Essential (primary) hypertension: Secondary | ICD-10-CM | POA: Diagnosis not present

## 2023-02-18 DIAGNOSIS — E1122 Type 2 diabetes mellitus with diabetic chronic kidney disease: Secondary | ICD-10-CM | POA: Diagnosis not present

## 2023-02-18 DIAGNOSIS — E538 Deficiency of other specified B group vitamins: Secondary | ICD-10-CM | POA: Diagnosis not present

## 2023-02-18 DIAGNOSIS — E7849 Other hyperlipidemia: Secondary | ICD-10-CM | POA: Diagnosis not present

## 2023-02-18 DIAGNOSIS — R634 Abnormal weight loss: Secondary | ICD-10-CM | POA: Diagnosis not present

## 2023-02-18 DIAGNOSIS — J329 Chronic sinusitis, unspecified: Secondary | ICD-10-CM | POA: Diagnosis not present

## 2023-02-18 DIAGNOSIS — K580 Irritable bowel syndrome with diarrhea: Secondary | ICD-10-CM | POA: Diagnosis not present

## 2023-02-18 DIAGNOSIS — D692 Other nonthrombocytopenic purpura: Secondary | ICD-10-CM | POA: Diagnosis not present

## 2023-02-24 DIAGNOSIS — K580 Irritable bowel syndrome with diarrhea: Secondary | ICD-10-CM | POA: Diagnosis not present

## 2023-02-24 DIAGNOSIS — I10 Essential (primary) hypertension: Secondary | ICD-10-CM | POA: Diagnosis not present

## 2023-02-24 DIAGNOSIS — E7849 Other hyperlipidemia: Secondary | ICD-10-CM | POA: Diagnosis not present

## 2023-02-24 DIAGNOSIS — E538 Deficiency of other specified B group vitamins: Secondary | ICD-10-CM | POA: Diagnosis not present

## 2023-02-24 DIAGNOSIS — J329 Chronic sinusitis, unspecified: Secondary | ICD-10-CM | POA: Diagnosis not present

## 2023-02-24 DIAGNOSIS — F411 Generalized anxiety disorder: Secondary | ICD-10-CM | POA: Diagnosis not present

## 2023-02-24 DIAGNOSIS — E440 Moderate protein-calorie malnutrition: Secondary | ICD-10-CM | POA: Diagnosis not present

## 2023-02-24 DIAGNOSIS — E1142 Type 2 diabetes mellitus with diabetic polyneuropathy: Secondary | ICD-10-CM | POA: Diagnosis not present

## 2023-02-24 DIAGNOSIS — F331 Major depressive disorder, recurrent, moderate: Secondary | ICD-10-CM | POA: Diagnosis not present

## 2023-02-24 DIAGNOSIS — E1122 Type 2 diabetes mellitus with diabetic chronic kidney disease: Secondary | ICD-10-CM | POA: Diagnosis not present

## 2023-02-24 DIAGNOSIS — D692 Other nonthrombocytopenic purpura: Secondary | ICD-10-CM | POA: Diagnosis not present

## 2023-02-24 DIAGNOSIS — R634 Abnormal weight loss: Secondary | ICD-10-CM | POA: Diagnosis not present

## 2023-02-26 DIAGNOSIS — F419 Anxiety disorder, unspecified: Secondary | ICD-10-CM | POA: Diagnosis present

## 2023-02-26 DIAGNOSIS — E063 Autoimmune thyroiditis: Secondary | ICD-10-CM | POA: Diagnosis not present

## 2023-02-26 DIAGNOSIS — R918 Other nonspecific abnormal finding of lung field: Secondary | ICD-10-CM | POA: Diagnosis not present

## 2023-02-26 DIAGNOSIS — Z90710 Acquired absence of both cervix and uterus: Secondary | ICD-10-CM | POA: Diagnosis not present

## 2023-02-26 DIAGNOSIS — R636 Underweight: Secondary | ICD-10-CM | POA: Diagnosis not present

## 2023-02-26 DIAGNOSIS — R1310 Dysphagia, unspecified: Secondary | ICD-10-CM | POA: Diagnosis not present

## 2023-02-26 DIAGNOSIS — J40 Bronchitis, not specified as acute or chronic: Secondary | ICD-10-CM | POA: Diagnosis not present

## 2023-02-26 DIAGNOSIS — M40204 Unspecified kyphosis, thoracic region: Secondary | ICD-10-CM | POA: Diagnosis not present

## 2023-02-26 DIAGNOSIS — Z885 Allergy status to narcotic agent status: Secondary | ICD-10-CM | POA: Diagnosis not present

## 2023-02-26 DIAGNOSIS — Z681 Body mass index (BMI) 19 or less, adult: Secondary | ICD-10-CM | POA: Diagnosis not present

## 2023-02-26 DIAGNOSIS — R059 Cough, unspecified: Secondary | ICD-10-CM | POA: Diagnosis not present

## 2023-02-26 DIAGNOSIS — R000 Tachycardia, unspecified: Secondary | ICD-10-CM | POA: Diagnosis not present

## 2023-02-26 DIAGNOSIS — I351 Nonrheumatic aortic (valve) insufficiency: Secondary | ICD-10-CM | POA: Diagnosis not present

## 2023-02-26 DIAGNOSIS — K219 Gastro-esophageal reflux disease without esophagitis: Secondary | ICD-10-CM | POA: Diagnosis present

## 2023-02-26 DIAGNOSIS — Z886 Allergy status to analgesic agent status: Secondary | ICD-10-CM | POA: Diagnosis not present

## 2023-02-26 DIAGNOSIS — J984 Other disorders of lung: Secondary | ICD-10-CM | POA: Diagnosis not present

## 2023-02-26 DIAGNOSIS — Z66 Do not resuscitate: Secondary | ICD-10-CM | POA: Diagnosis present

## 2023-02-26 DIAGNOSIS — E1165 Type 2 diabetes mellitus with hyperglycemia: Secondary | ICD-10-CM | POA: Diagnosis not present

## 2023-02-26 DIAGNOSIS — E43 Unspecified severe protein-calorie malnutrition: Secondary | ICD-10-CM | POA: Diagnosis present

## 2023-02-26 DIAGNOSIS — I10 Essential (primary) hypertension: Secondary | ICD-10-CM | POA: Diagnosis not present

## 2023-02-26 DIAGNOSIS — R069 Unspecified abnormalities of breathing: Secondary | ICD-10-CM | POA: Diagnosis not present

## 2023-02-26 DIAGNOSIS — E782 Mixed hyperlipidemia: Secondary | ICD-10-CM | POA: Diagnosis present

## 2023-02-26 DIAGNOSIS — Z882 Allergy status to sulfonamides status: Secondary | ICD-10-CM | POA: Diagnosis not present

## 2023-02-26 DIAGNOSIS — Z7982 Long term (current) use of aspirin: Secondary | ICD-10-CM | POA: Diagnosis not present

## 2023-02-26 DIAGNOSIS — Z1152 Encounter for screening for COVID-19: Secondary | ICD-10-CM | POA: Diagnosis not present

## 2023-02-26 DIAGNOSIS — J690 Pneumonitis due to inhalation of food and vomit: Secondary | ICD-10-CM | POA: Diagnosis present

## 2023-02-26 DIAGNOSIS — E46 Unspecified protein-calorie malnutrition: Secondary | ICD-10-CM | POA: Diagnosis not present

## 2023-02-26 DIAGNOSIS — R0989 Other specified symptoms and signs involving the circulatory and respiratory systems: Secondary | ICD-10-CM | POA: Diagnosis not present

## 2023-02-26 DIAGNOSIS — R7989 Other specified abnormal findings of blood chemistry: Secondary | ICD-10-CM | POA: Diagnosis not present

## 2023-02-26 DIAGNOSIS — K52832 Lymphocytic colitis: Secondary | ICD-10-CM | POA: Diagnosis present

## 2023-02-26 DIAGNOSIS — J90 Pleural effusion, not elsewhere classified: Secondary | ICD-10-CM | POA: Diagnosis not present

## 2023-02-26 DIAGNOSIS — R0902 Hypoxemia: Secondary | ICD-10-CM | POA: Diagnosis present

## 2023-02-26 DIAGNOSIS — R739 Hyperglycemia, unspecified: Secondary | ICD-10-CM | POA: Diagnosis not present

## 2023-02-26 DIAGNOSIS — N184 Chronic kidney disease, stage 4 (severe): Secondary | ICD-10-CM | POA: Diagnosis present

## 2023-02-26 DIAGNOSIS — E870 Hyperosmolality and hypernatremia: Secondary | ICD-10-CM | POA: Diagnosis present

## 2023-02-26 DIAGNOSIS — J189 Pneumonia, unspecified organism: Secondary | ICD-10-CM | POA: Diagnosis not present

## 2023-02-26 DIAGNOSIS — R54 Age-related physical debility: Secondary | ICD-10-CM | POA: Diagnosis present

## 2023-02-26 DIAGNOSIS — E1122 Type 2 diabetes mellitus with diabetic chronic kidney disease: Secondary | ICD-10-CM | POA: Diagnosis present

## 2023-02-26 DIAGNOSIS — Z8673 Personal history of transient ischemic attack (TIA), and cerebral infarction without residual deficits: Secondary | ICD-10-CM | POA: Diagnosis not present

## 2023-02-26 DIAGNOSIS — E119 Type 2 diabetes mellitus without complications: Secondary | ICD-10-CM | POA: Diagnosis not present

## 2023-02-26 DIAGNOSIS — Z7984 Long term (current) use of oral hypoglycemic drugs: Secondary | ICD-10-CM | POA: Diagnosis not present

## 2023-02-26 DIAGNOSIS — R1312 Dysphagia, oropharyngeal phase: Secondary | ICD-10-CM | POA: Diagnosis present

## 2023-02-26 DIAGNOSIS — F331 Major depressive disorder, recurrent, moderate: Secondary | ICD-10-CM | POA: Diagnosis present

## 2023-02-26 DIAGNOSIS — R0602 Shortness of breath: Secondary | ICD-10-CM | POA: Diagnosis not present

## 2023-02-26 DIAGNOSIS — I69391 Dysphagia following cerebral infarction: Secondary | ICD-10-CM | POA: Diagnosis not present

## 2023-02-26 DIAGNOSIS — Z20822 Contact with and (suspected) exposure to covid-19: Secondary | ICD-10-CM | POA: Diagnosis not present

## 2023-02-27 DIAGNOSIS — E43 Unspecified severe protein-calorie malnutrition: Secondary | ICD-10-CM | POA: Diagnosis present

## 2023-02-27 DIAGNOSIS — I10 Essential (primary) hypertension: Secondary | ICD-10-CM | POA: Diagnosis not present

## 2023-02-27 DIAGNOSIS — Z885 Allergy status to narcotic agent status: Secondary | ICD-10-CM | POA: Diagnosis not present

## 2023-02-27 DIAGNOSIS — Z681 Body mass index (BMI) 19 or less, adult: Secondary | ICD-10-CM | POA: Diagnosis not present

## 2023-02-27 DIAGNOSIS — E1165 Type 2 diabetes mellitus with hyperglycemia: Secondary | ICD-10-CM | POA: Diagnosis present

## 2023-02-27 DIAGNOSIS — I351 Nonrheumatic aortic (valve) insufficiency: Secondary | ICD-10-CM | POA: Diagnosis not present

## 2023-02-27 DIAGNOSIS — E870 Hyperosmolality and hypernatremia: Secondary | ICD-10-CM | POA: Diagnosis present

## 2023-02-27 DIAGNOSIS — K219 Gastro-esophageal reflux disease without esophagitis: Secondary | ICD-10-CM | POA: Diagnosis present

## 2023-02-27 DIAGNOSIS — R069 Unspecified abnormalities of breathing: Secondary | ICD-10-CM | POA: Diagnosis not present

## 2023-02-27 DIAGNOSIS — Z90710 Acquired absence of both cervix and uterus: Secondary | ICD-10-CM | POA: Diagnosis not present

## 2023-02-27 DIAGNOSIS — J984 Other disorders of lung: Secondary | ICD-10-CM | POA: Diagnosis not present

## 2023-02-27 DIAGNOSIS — Z882 Allergy status to sulfonamides status: Secondary | ICD-10-CM | POA: Diagnosis not present

## 2023-02-27 DIAGNOSIS — F331 Major depressive disorder, recurrent, moderate: Secondary | ICD-10-CM | POA: Diagnosis present

## 2023-02-27 DIAGNOSIS — N184 Chronic kidney disease, stage 4 (severe): Secondary | ICD-10-CM | POA: Diagnosis present

## 2023-02-27 DIAGNOSIS — E782 Mixed hyperlipidemia: Secondary | ICD-10-CM | POA: Diagnosis present

## 2023-02-27 DIAGNOSIS — E063 Autoimmune thyroiditis: Secondary | ICD-10-CM | POA: Diagnosis not present

## 2023-02-27 DIAGNOSIS — R918 Other nonspecific abnormal finding of lung field: Secondary | ICD-10-CM | POA: Diagnosis not present

## 2023-02-27 DIAGNOSIS — Z8673 Personal history of transient ischemic attack (TIA), and cerebral infarction without residual deficits: Secondary | ICD-10-CM | POA: Diagnosis not present

## 2023-02-27 DIAGNOSIS — Z66 Do not resuscitate: Secondary | ICD-10-CM | POA: Diagnosis present

## 2023-02-27 DIAGNOSIS — R739 Hyperglycemia, unspecified: Secondary | ICD-10-CM | POA: Diagnosis not present

## 2023-02-27 DIAGNOSIS — R1312 Dysphagia, oropharyngeal phase: Secondary | ICD-10-CM | POA: Diagnosis present

## 2023-02-27 DIAGNOSIS — F419 Anxiety disorder, unspecified: Secondary | ICD-10-CM | POA: Diagnosis present

## 2023-02-27 DIAGNOSIS — R0902 Hypoxemia: Secondary | ICD-10-CM | POA: Diagnosis present

## 2023-02-27 DIAGNOSIS — R636 Underweight: Secondary | ICD-10-CM | POA: Diagnosis not present

## 2023-02-27 DIAGNOSIS — R7989 Other specified abnormal findings of blood chemistry: Secondary | ICD-10-CM | POA: Diagnosis not present

## 2023-02-27 DIAGNOSIS — R0989 Other specified symptoms and signs involving the circulatory and respiratory systems: Secondary | ICD-10-CM | POA: Diagnosis not present

## 2023-02-27 DIAGNOSIS — R1310 Dysphagia, unspecified: Secondary | ICD-10-CM | POA: Diagnosis not present

## 2023-02-27 DIAGNOSIS — E119 Type 2 diabetes mellitus without complications: Secondary | ICD-10-CM | POA: Diagnosis not present

## 2023-02-27 DIAGNOSIS — J90 Pleural effusion, not elsewhere classified: Secondary | ICD-10-CM | POA: Diagnosis not present

## 2023-02-27 DIAGNOSIS — R000 Tachycardia, unspecified: Secondary | ICD-10-CM | POA: Diagnosis not present

## 2023-02-27 DIAGNOSIS — E1122 Type 2 diabetes mellitus with diabetic chronic kidney disease: Secondary | ICD-10-CM | POA: Diagnosis present

## 2023-02-27 DIAGNOSIS — Z1152 Encounter for screening for COVID-19: Secondary | ICD-10-CM | POA: Diagnosis not present

## 2023-02-27 DIAGNOSIS — Z7984 Long term (current) use of oral hypoglycemic drugs: Secondary | ICD-10-CM | POA: Diagnosis not present

## 2023-02-27 DIAGNOSIS — J690 Pneumonitis due to inhalation of food and vomit: Secondary | ICD-10-CM | POA: Diagnosis present

## 2023-02-27 DIAGNOSIS — I69391 Dysphagia following cerebral infarction: Secondary | ICD-10-CM | POA: Diagnosis not present

## 2023-02-27 DIAGNOSIS — Z886 Allergy status to analgesic agent status: Secondary | ICD-10-CM | POA: Diagnosis not present

## 2023-02-27 DIAGNOSIS — R54 Age-related physical debility: Secondary | ICD-10-CM | POA: Diagnosis present

## 2023-02-27 DIAGNOSIS — K52832 Lymphocytic colitis: Secondary | ICD-10-CM | POA: Diagnosis present

## 2023-02-27 DIAGNOSIS — R0602 Shortness of breath: Secondary | ICD-10-CM | POA: Diagnosis not present

## 2023-02-27 DIAGNOSIS — Z7982 Long term (current) use of aspirin: Secondary | ICD-10-CM | POA: Diagnosis not present

## 2023-02-27 DIAGNOSIS — E46 Unspecified protein-calorie malnutrition: Secondary | ICD-10-CM | POA: Diagnosis not present

## 2023-02-27 DIAGNOSIS — J189 Pneumonia, unspecified organism: Secondary | ICD-10-CM | POA: Diagnosis not present

## 2023-03-19 DEATH — deceased

## 2023-04-03 ENCOUNTER — Ambulatory Visit (INDEPENDENT_AMBULATORY_CARE_PROVIDER_SITE_OTHER): Payer: Medicare Other
